# Patient Record
Sex: Male | Born: 2000 | Race: White | Hispanic: No | Marital: Single | Smoking: Former smoker
Health system: Southern US, Community
[De-identification: ages and names within clinical notes are randomized; demographics above are authoritative.]

## PROBLEM LIST (undated history)

## (undated) HISTORY — PX: HYDROCELE EXCISION: SHX482

---
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
## 2022-01-16 ENCOUNTER — Ambulatory Visit (INDEPENDENT_AMBULATORY_CARE_PROVIDER_SITE_OTHER): Payer: Medicaid Other

## 2022-01-16 ENCOUNTER — Ambulatory Visit (INDEPENDENT_AMBULATORY_CARE_PROVIDER_SITE_OTHER): Payer: Medicaid Other | Admitting: Orthopedic Surgery

## 2022-01-16 DIAGNOSIS — S62025K Nondisplaced fracture of middle third of navicular [scaphoid] bone of left wrist, subsequent encounter for fracture with nonunion: Secondary | ICD-10-CM

## 2022-01-16 NOTE — Progress Notes (Signed)
? ?  Post-Op Visit Note ?  ?Patient: Randy Schmitt           ?Date of Birth: [birth year]           ?MRN: [id number] ?Visit Date: 01/16/2022 ?PCP: Babs Sciara, MD ? ? ?Assessment & Plan: ? ?Chief Complaint:  ?Chief Complaint  ?Patient presents with  ? Left Wrist - Routine Post Op  ? ?Visit Diagnoses:  ?1. 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 ? ?Plan: Patient is now 6 weeks s/p ORIF of a left scaphoid nonunion with distal radius autograft.  There was an intraoperative fracture of the distal pole with screw insertion.  X-rays today show unchanged scaphoid waist fracture alignment with possible interval healing compared to preop imaging.  The distal pole fragment remains displaced.  He has no complaints today.  There is no significant swelling.  He is able to make a complete fist and fully extend his fingers.  I will put him back in a cast and see him again in another month.  ? ?Follow-Up Instructions: No follow-ups on file.  ? ?Orders:  ?Orders Placed This Encounter  ?Procedures  ? XR Wrist Complete Left  ? ?No orders of the defined types were placed in this encounter. ? ? ?Imaging: ?No results found. ? ?PMFS History: ?Patient Active Problem List  ? Diagnosis Date Noted  ? 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 Alcohol use disorder, severe, in early remission, in controlled environment [facility])   ? 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 Meningitis 07/19/2012  ? ?Past Medical History:  ?Diagnosis Date  ? Alcohol use disorder, mild, abuse 10/30/2021  ? 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 Meningitis 07/22/2012  ?  ?Family History  ?Problem Relation Age of Onset  ? Hypertension Father   ? Hypertension Maternal Grandfather   ? Healthy Mother   ?  ?Past Surgical History:  ?Procedure Laterality Date  ? HYDROCELE EXCISION    ? ORIF SCAPHOID FRACTURE Left  12/03/2021  ? Procedure: LEFT OPEN REDUCTION INTERNAL FIXATION (ORIF) SCAPHOID FRACTURE WITH DISTAL RADIUS BONE GRAFT HARVEST;  Surgeon: Marlyne Beards, MD;  Location: [facility];  Service: Orthopedics;  Laterality: Left;  Regional block  ? ?Social History  ? ?Occupational History  ? Not on file  ?Tobacco Use  ? Smoking status: Every Day  ?  Packs/day: 1.00  ?  Types: Cigarettes  ?  Passive exposure: Yes  ? Smokeless tobacco: Never  ?Vaping Use  ? Vaping Use: Never used  ?Substance and Sexual Activity  ? Alcohol use: Yes  ?  Comment: up to 12 pk a day on average- Going to AA mtgs 11/24/21  ? Drug use: Yes  ?  Frequency: 2.0 times per week  ?  Types: Marijuana  ? Sexual activity: Not on file  ? ? ? ?

## 2022-02-17 ENCOUNTER — Ambulatory Visit (INDEPENDENT_AMBULATORY_CARE_PROVIDER_SITE_OTHER): Payer: Medicaid Other

## 2022-02-17 ENCOUNTER — Ambulatory Visit (INDEPENDENT_AMBULATORY_CARE_PROVIDER_SITE_OTHER): Payer: Medicaid Other | Admitting: Orthopedic Surgery

## 2022-02-17 DIAGNOSIS — S62025K Nondisplaced fracture of middle third of navicular [scaphoid] bone of left wrist, subsequent encounter for fracture with nonunion: Secondary | ICD-10-CM

## 2022-02-17 DIAGNOSIS — S62022A Displaced fracture of middle third of navicular [scaphoid] bone of left wrist, initial encounter for closed fracture: Secondary | ICD-10-CM

## 2022-02-17 NOTE — Progress Notes (Signed)
   Post-Op Visit Note   Patient: Randy Schmitt           Date of Birth: June 13, 2001           MRN: [id number] Visit Date: 02/17/2022 PCP: Babs Sciara, MD   Assessment & Plan:  Chief Complaint:  Chief Complaint  Patient presents with   Left Wrist - Follow-up, Fracture, Routine Post Op   Visit Diagnoses:  1. 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 Closed displaced fracture of middle third of scaphoid bone of left wrist, initial encounter     Plan: Patient is 10 weeks s/p ORIF of left scaphoid fracture nonunion with intraop fracture of the distal pole with screw insertion.  He has been in a cast for the last two months.  He only describes pain at the area of bone graft harvest with retropulsion of thumb. Repeat x-rays today appear to show interval healing of the scaphoid fracture with apparent filling in of the previous void from the nonunion debridement.  I will get a CT to better evaluate the scaphoid waist fracture healing and will see him back in the office once the CT is done.   Follow-Up Instructions: No follow-ups on file.   Orders:  Orders Placed This Encounter  Procedures   XR Wrist Complete Left   No orders of the defined types were placed in this encounter.   Imaging: No results found.  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   Meningitis 07/22/2012    Family History  Problem Relation Age of Onset   Hypertension Father    Hypertension Maternal Grandfather     Healthy Mother     Past Surgical History:  Procedure Laterality Date   HYDROCELE EXCISION     ORIF SCAPHOID FRACTURE Left 12/03/2021   Procedure: LEFT OPEN REDUCTION INTERNAL FIXATION (ORIF) SCAPHOID FRACTURE WITH DISTAL RADIUS BONE GRAFT HARVEST;  Surgeon: Marlyne Beards, MD;  Location: [facility];  Service: Orthopedics;  Laterality: Left;  Regional block   Social History   Occupational History   Not on file  Tobacco Use   Smoking status: Every Day    Packs/day: 1.00    Types: Cigarettes    Passive exposure: Yes   Smokeless tobacco: Never  Vaping Use   Vaping Use: Never used  Substance and Sexual Activity   Alcohol use: Yes    Comment: up to 12 pk a day on average- Going to AA mtgs 11/24/21   Drug use: Yes    Frequency: 2.0 times per week    Types: Marijuana   Sexual activity: Not on file

## 2022-02-24 ENCOUNTER — Ambulatory Visit (HOSPITAL_COMMUNITY)
Admission: RE | Admit: 2022-02-24 | Discharge: 2022-02-24 | Disposition: A | Discharge status: Home / Self Care | Payer: Medicaid Other | Source: Ambulatory Visit | Attending: Orthopedic Surgery | Admitting: Orthopedic Surgery

## 2022-02-24 DIAGNOSIS — S62022A Displaced fracture of middle third of navicular [scaphoid] bone of left wrist, initial encounter for closed fracture: Secondary | ICD-10-CM | POA: Diagnosis not present

## 2022-02-24 DIAGNOSIS — Z9889 Other specified postprocedural states: Secondary | ICD-10-CM | POA: Diagnosis not present

## 2022-02-24 DIAGNOSIS — S62002D Unspecified fracture of navicular [scaphoid] bone of left wrist, subsequent encounter for fracture with routine healing: Secondary | ICD-10-CM | POA: Diagnosis not present

## 2022-02-26 ENCOUNTER — Ambulatory Visit (INDEPENDENT_AMBULATORY_CARE_PROVIDER_SITE_OTHER): Payer: Medicaid Other | Admitting: Orthopedic Surgery

## 2022-02-26 DIAGNOSIS — S62025K Nondisplaced fracture of middle third of navicular [scaphoid] bone of left wrist, subsequent encounter for fracture with nonunion: Secondary | ICD-10-CM

## 2022-02-26 NOTE — Progress Notes (Signed)
   Post-Op Visit Note   Patient: Randy Schmitt           Date of Birth: June 30, 2001           MRN: [id number] Visit Date: 02/26/2022 PCP: Babs Sciara, MD   Assessment & Plan:  Chief Complaint:  Chief Complaint  Patient presents with   Left Wrist - Follow-up   Visit Diagnoses:  1. 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  He had an intraoperative fracture of the scaphoid tuberosity.  He was then went CT of the wrist which showed partial healing of the scaphoid fracture mostly at the posterior aspect.  Discussed with patient that I would like to keep him immobilized for another month or so to allow the nonunion site to continue to heal.  We will get repeat x-rays of the scaphoid when I see him back.  Follow-Up Instructions: No follow-ups on file.   Orders:  No orders of the defined types were placed in this encounter.  No orders of the defined types were placed in this encounter.   Imaging: No results found.  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   Meningitis 07/22/2012    Family History  Problem Relation Age of Onset   Hypertension Father    Hypertension Maternal Grandfather    Healthy Mother     Past Surgical History:  Procedure Laterality Date   HYDROCELE EXCISION     ORIF SCAPHOID FRACTURE Left 12/03/2021   Procedure: LEFT OPEN REDUCTION INTERNAL  FIXATION (ORIF) SCAPHOID FRACTURE WITH DISTAL RADIUS BONE GRAFT HARVEST;  Surgeon: Marlyne Beards, MD;  Location: [facility];  Service: Orthopedics;  Laterality: Left;  Regional block   Social History   Occupational History   Not on file  Tobacco Use   Smoking status: Every Day    Packs/day: 1.00    Types: Cigarettes    Passive exposure: Yes   Smokeless tobacco: Never  Vaping Use   Vaping Use: Never used  Substance and Sexual Activity   Alcohol use: Yes    Comment: up to 12 pk a day on average- Going to AA mtgs 11/24/21   Drug use: Yes    Frequency: 2.0 times per week    Types: Marijuana   Sexual activity: Not on file

## 2022-03-04 ENCOUNTER — Ambulatory Visit (INDEPENDENT_AMBULATORY_CARE_PROVIDER_SITE_OTHER): Payer: Medicaid Other | Admitting: Family Medicine

## 2022-03-04 DIAGNOSIS — F419 Anxiety disorder, unspecified: Secondary | ICD-10-CM

## 2022-03-04 MED ORDER — NICOTINE 21 MG/24HR TD PT24: 21.0000 mg | MEDICATED_PATCH | Freq: Every day | TRANSDERMAL | 2 refills | Status: DC

## 2022-03-04 MED ORDER — BUSPIRONE HCL 7.5 MG PO TABS: 7.5000 mg | ORAL_TABLET | Freq: Two times a day (BID) | ORAL | 3 refills | Status: DC

## 2022-03-04 NOTE — Patient Instructions (Signed)
Try the Buspar.  Follow up in 6 weeks with Dr. Nicki Reaper.  I have refilled the nicotine patches.  Take care  Dr. Lacinda Axon

## 2022-03-04 NOTE — Assessment & Plan Note (Signed)
Trial of BuSpar. 

## 2022-03-04 NOTE — Progress Notes (Signed)
Subjective:  Patient ID: Randy Schmitt, male    DOB: [birth year]  Age: 21 y.o. MRN: [id number]  CC: Chief Complaint  Patient presents with   Anxiety    Panic attcaks, Cant sleep, smoking cessation prescription needed    HPI:  21 year old male presents for evaluation of the above.  Patient states that he is having anxiety and panic attacks and is having trouble focusing.  He also reports ongoing insomnia.  He states that he is no longer drinking alcohol on a regular basis.  He does continue to smoke and would like to restart nicotine patches.  Patient has previously been on Lexapro and hydroxyzine regarding his mood.  However, he has not been compliant with this.  Patient states that he has been having trouble for some time.  He is interested in pharmacotherapy.  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 11/07/2021   Alcohol use disorder, severe, in early remission, in controlled environment [facility])    Aseptic meningitis 07/22/2012    Social Hx   Social History   Socioeconomic History   Marital status: Single    Spouse name: Not on file   Number of children: Not on file   Years of education: Not on file   Highest education level: Not on file  Occupational History   Not on file  Tobacco Use   Smoking status: Every Day    Packs/day: 1.00    Types: Cigarettes    Passive exposure: Yes   Smokeless tobacco: Never  Vaping Use   Vaping Use: Never used  Substance and Sexual Activity   Alcohol use: Yes    Comment: up to 12 pk a day on average- Going to AA mtgs 11/24/21   Drug use: Yes    Frequency: 2.0 times per week    Types: Marijuana   Sexual activity: Not on file  Other Topics Concern   Not on file  Social History Narrative   Not on file   Social Determinants of Health   Financial Resource Strain: Not on file  Food Insecurity: Not on file   Transportation Needs: Not on file  Physical Activity: Not on file  Stress: Not on file  Social Connections: Not on file    Review of Systems Per HPI  Objective:  BP 127/77   Pulse 77   Temp 98.8 F (37.1 C)   Ht 6\' 1"  (1.854 m)   Wt 218 lb (98.9 kg)   SpO2 99%   BMI 28.76 kg/m      03/04/2022   11:10 AM 12/03/2021   12:05 PM 12/03/2021   11:30 AM  BP/Weight  Systolic BP 127 170 133  Diastolic BP 77 90 70  Wt. (Lbs) 218    BMI 28.76 kg/m2      Physical Exam Vitals and nursing note reviewed.  Constitutional:      General: He is not in acute distress.    Appearance: Normal appearance.  Eyes:     General:        Right eye: No discharge.        Left eye: No discharge.     Conjunctiva/sclera: Conjunctivae normal.  Cardiovascular:     Rate and Rhythm: Normal rate and regular rhythm.  Pulmonary:     Effort: Pulmonary effort is normal.     Breath sounds: Normal breath sounds. No wheezing, rhonchi or  rales.  Neurological:     Mental Status: He is alert.  Psychiatric:        Mood and Affect: Mood normal.        Behavior: Behavior normal.    Lab Results  Component Value Date   WBC 12.0 (H) 10/30/2021   HGB 16.3 10/30/2021   HCT 48.6 10/30/2021   PLT 266 10/30/2021   GLUCOSE 67 (L) 10/30/2021   CHOL 174 (H) 10/30/2021   TRIG 203 (H) 10/30/2021   HDL 67 10/30/2021   LDLCALC 66 10/30/2021   ALT 22 10/30/2021   AST 34 10/30/2021   NA 143 10/30/2021   K 4.2 10/30/2021   CL 104 10/30/2021   CREATININE 0.71 10/30/2021   BUN 9 10/30/2021   CO2 26 10/30/2021   TSH 0.709 10/30/2021   HGBA1C 5.0 10/30/2021     Assessment & Plan:   Problem List Items Addressed This Visit       Other   Anxiety    Trial of BuSpar.       Relevant Medications   busPIRone (BUSPAR) 7.5 MG tablet    Meds ordered this encounter  Medications   nicotine (NICODERM CQ - DOSED IN MG/24 HOURS) 21 mg/24hr patch    Sig: Place 1 patch (21 mg total) onto the skin daily.    Dispense:   28 patch    Refill:  2   busPIRone (BUSPAR) 7.5 MG tablet    Sig: Take 1 tablet (7.5 mg total) by mouth 2 (two) times daily.    Dispense:  60 tablet    Refill:  3    Follow-up:  Return in about 6 weeks (around 04/15/2022).  Everlene Other DO [facility]

## 2022-03-30 ENCOUNTER — Ambulatory Visit: Payer: Medicaid Other | Admitting: Orthopedic Surgery

## 2022-04-06 ENCOUNTER — Ambulatory Visit (INDEPENDENT_AMBULATORY_CARE_PROVIDER_SITE_OTHER): Payer: Medicaid Other | Admitting: Orthopedic Surgery

## 2022-04-06 ENCOUNTER — Ambulatory Visit (INDEPENDENT_AMBULATORY_CARE_PROVIDER_SITE_OTHER): Payer: Medicaid Other

## 2022-04-06 DIAGNOSIS — S62025A Nondisplaced fracture of middle third of navicular [scaphoid] bone of left wrist, initial encounter for closed fracture: Secondary | ICD-10-CM | POA: Diagnosis not present

## 2022-04-06 DIAGNOSIS — S62022A Displaced fracture of middle third of navicular [scaphoid] bone of left wrist, initial encounter for closed fracture: Secondary | ICD-10-CM

## 2022-04-06 NOTE — Progress Notes (Signed)
Office Visit Note   Patient: Randy Schmitt           Date of Birth: [birth year]           MRN: [id number] Visit Date: 04/06/2022              Requested by: Babs Sciara, MD [address] [place],  [place] [address] PCP: Babs Sciara, MD   Assessment & Plan: Visit Diagnoses:  1. Closed displaced fracture of middle third of scaphoid bone of left wrist, initial encounter   2. Closed nondisplaced fracture of middle third of scaphoid bone of left wrist, initial encounter     Plan: Patient is approximately 16 weeks s/p ORIF left scaphoid waist nonunion with distal radius autograft and headless compression screw fixation.  There was an intraop fracture of the scaphoid tuberosity with introduction of the trailing end of the screw.  He has some stiffness and tightness in the wrist today which is expected given his prolonged casting.  He has some tenderness to palpation at the volar aspect of the wrist over the incision which may be related to that tuberosity fragment.  Repeat x-rays do seem to show interval healing of the fracture.  I would like to get a CT scan to further evaluate the interval healing of the fracture.  In the mean time, he can have a removable wrist brace for work.  I can see him back once the CT scan is completed.   Follow-Up Instructions: No follow-ups on file.   Orders:  Orders Placed This Encounter  Procedures   XR Wrist Complete Left   CT WRIST LEFT WO CONTRAST   No orders of the defined types were placed in this encounter.     Procedures: No procedures performed   Clinical Data: No additional findings.   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  He is now 16 weeks status post ORIF of the left scaphoid through a volar approach with use of distal radius autograft.  He has  been in a cast since then.  He is doing well today.  He does have some tightness in his wrist which is predictable given his prolonged immobilization.  He is overall without complaint.    Review of Systems   Objective: Vital Signs: There were no vitals taken for this visit.  Physical Exam Constitutional:      Appearance: Normal appearance.  Cardiovascular:     Rate and Rhythm: Normal rate.     Pulses: Normal pulses.  Pulmonary:     Effort: Pulmonary effort is normal.  Skin:    General: Skin is warm and dry.     Capillary Refill: Capillary refill takes less than 2 seconds.  Neurological:     Mental Status: He is alert.     Left Hand Exam   Tenderness  Left hand tenderness location: Mildly TTP at volar aspect of scaphoid over incision.   Other  Erythema: absent Sensation: normal Pulse: present  Comments:  Painless wrist ROM but somewhat limited by stiffness.       Specialty Comments:  No specialty comments available.  Imaging: No results found.   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   Meningitis 07/22/2012    Family History  Problem Relation Age of Onset   Hypertension Father    Hypertension Maternal Grandfather    Healthy Mother     Past Surgical History:  Procedure Laterality Date   HYDROCELE EXCISION     ORIF SCAPHOID FRACTURE Left 12/03/2021   Procedure: LEFT OPEN REDUCTION INTERNAL FIXATION (ORIF) SCAPHOID FRACTURE WITH DISTAL RADIUS BONE GRAFT HARVEST;  Surgeon: Marlyne Beards, MD;  Location: [facility];  Service: Orthopedics;  Laterality: Left;  Regional block   Social History   Occupational History   Not on file  Tobacco Use   Smoking status: Every Day    Packs/day: 1.00    Types: Cigarettes    Passive exposure: Yes   Smokeless tobacco: Never  Vaping Use   Vaping Use: Never used  Substance and Sexual Activity   Alcohol use: Yes    Comment: up to 12 pk a day on average- Going to AA mtgs 11/24/21   Drug use: Yes    Frequency: 2.0 times per week    Types: Marijuana   Sexual activity: Not on file

## 2022-04-17 ENCOUNTER — Ambulatory Visit (HOSPITAL_COMMUNITY)
Admission: RE | Admit: 2022-04-17 | Discharge: 2022-04-17 | Disposition: A | Discharge status: Home / Self Care | Payer: Medicaid Other | Source: Ambulatory Visit | Attending: Orthopedic Surgery | Admitting: Orthopedic Surgery

## 2022-04-17 DIAGNOSIS — S62022A Displaced fracture of middle third of navicular [scaphoid] bone of left wrist, initial encounter for closed fracture: Secondary | ICD-10-CM | POA: Diagnosis present

## 2022-04-17 DIAGNOSIS — S62025A Nondisplaced fracture of middle third of navicular [scaphoid] bone of left wrist, initial encounter for closed fracture: Secondary | ICD-10-CM | POA: Insufficient documentation

## 2022-04-21 ENCOUNTER — Ambulatory Visit (INDEPENDENT_AMBULATORY_CARE_PROVIDER_SITE_OTHER): Payer: Medicaid Other | Admitting: Family Medicine

## 2022-04-21 VITALS — BP 136/84 | HR 71 | Temp 97.3°F | Ht 73.02 in | Wt 208.0 lb

## 2022-04-21 DIAGNOSIS — F411 Generalized anxiety disorder: Secondary | ICD-10-CM | POA: Diagnosis not present

## 2022-04-21 MED ORDER — ESCITALOPRAM OXALATE 10 MG PO TABS: 10.0000 mg | ORAL_TABLET | Freq: Every day | ORAL | 2 refills | Status: DC

## 2022-04-21 NOTE — Progress Notes (Signed)
   Subjective:    Patient ID: Randy Schmitt, male    DOB: [birth year], 21 y.o.   MRN: [id number]  HPI  6 week follow up for anxiety currently taking buspirone, no concerns voiced Insurance did not cover nicoderm CQ for smoking cessation Patient finds himself feeling anxious.  States BuSpar really did not help much Just gets a little anxious about various situations denies any particular anxiousness issues.  Denies being depressed.  Review of Systems     Objective:   Physical Exam  Today's visit was centered around  discussion      Assessment & Plan:  Anxiety Tobacco abuse History of alcohol use  Patient states he is staying away from alcohol basilar joint times his head and occasionally slipped up Has surrounded himself with family members who are supportive  BuSpar did not do much for him for anxiety.  In the past he did take Lexapro but did not like taking a antidepressant.  We talked about how antidepressants are often used for anxiety and I showed him some various findings regarding this.  After further discussion regarding the options of this medication versus Celexa or Zoloft patient agrees with Lexapro and would like to follow-up in 1 month.  He was warned that if it started making him feel depressed or suicidal.  Medicine reach out for help with this immediately.  In addition to this we will see him back in 1 month.  Benzodiazepines are not recommended because of the addictive potential  Smoking cessation with NicoDerm patch recommended strategies discussed

## 2022-04-23 ENCOUNTER — Other Ambulatory Visit: Payer: Self-pay | Admitting: Orthopedic Surgery

## 2022-04-23 DIAGNOSIS — S62022A Displaced fracture of middle third of navicular [scaphoid] bone of left wrist, initial encounter for closed fracture: Secondary | ICD-10-CM

## 2022-04-23 NOTE — Progress Notes (Signed)
Called patient to review CT results which show incomplete union of the previous scaphoid nonunion.  There is some bridging bone present but overall minimal healing.  Pt has been at work without the brace since the CT scan.  We discussed the nature of his fracture healing.  Discussed that we can try a bone stimulator to try to get the scaphoid nonunion to heal.  He will remain in the over the counter brace and he will be contacted by OT to have a thermoplast brace made.  I have reached out to [employer] regarding their device.  The appropriate forms will be faxed to them to initiate the process.  Marlyne Beards, M.D.  [facility]

## 2022-05-27 ENCOUNTER — Ambulatory Visit: Payer: Medicaid Other | Admitting: Family Medicine

## 2022-09-03 ENCOUNTER — Encounter: Payer: Self-pay | Admitting: Family Medicine

## 2022-09-03 ENCOUNTER — Ambulatory Visit (INDEPENDENT_AMBULATORY_CARE_PROVIDER_SITE_OTHER): Payer: Medicaid Other | Admitting: Family Medicine

## 2022-09-03 VITALS — BP 138/73 | HR 93 | Wt 216.4 lb

## 2022-09-03 DIAGNOSIS — H6092 Unspecified otitis externa, left ear: Secondary | ICD-10-CM

## 2022-09-03 HISTORY — DX: Unspecified otitis externa, left ear: H60.92

## 2022-09-03 MED ORDER — CIPROFLOXACIN-DEXAMETHASONE 0.3-0.1 % OT SUSP: 4.0000 [drp] | Freq: Two times a day (BID) | OTIC | 0 refills | Status: AC

## 2022-09-03 NOTE — Progress Notes (Signed)
Subjective:  Patient ID: Randy Schmitt, male    DOB: 04-17-2001  Age: 21 y.o. MRN: [id number]  CC: Chief Complaint  Patient presents with   Ear Discomfort    Pt arrives to have left ear checked.     HPI:  21 year old male presents for evaluation the above.  Patient states that over the past month he has had ongoing issues with his left ear.  He has had some pain and is also had some decreased hearing.  He states that he uses Q-tips on a regular basis.  It only affects the left ear.  No other associated symptoms.  No other complaints.  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 11/07/2021   Alcohol use disorder, severe, in early remission, in controlled environment [facility])    Aseptic meningitis 07/22/2012    Social Hx   Social History   Socioeconomic History   Marital status: Single    Spouse name: Not on file   Number of children: Not on file   Years of education: Not on file   Highest education level: Not on file  Occupational History   Not on file  Tobacco Use   Smoking status: Every Day    Packs/day: 1.00    Types: Cigarettes    Passive exposure: Yes   Smokeless tobacco: Never  Vaping Use   Vaping Use: Never used  Substance and Sexual Activity   Alcohol use: Yes    Comment: up to 12 pk a day on average- Going to AA mtgs 11/24/21   Drug use: Yes    Frequency: 2.0 times per week    Types: Marijuana   Sexual activity: Not on file  Other Topics Concern   Not on file  Social History Narrative   Not on file   Social Determinants of Health   Financial Resource Strain: Not on file  Food Insecurity: Not on file  Transportation Needs: Not on file  Physical Activity: Not on file  Stress: Not on file  Social Connections: Not on file    Review of Systems Per HPI  Objective:  BP 138/73   Pulse 93   Wt  216 lb 6.4 oz (98.2 kg)   SpO2 98%   BMI 28.53 kg/m      09/03/2022    8:53 AM 04/21/2022    8:11 AM 03/04/2022   11:10 AM  BP/Weight  Systolic BP 138 136 127  Diastolic BP 73 84 77  Wt. (Lbs) 216.4 208 218  BMI 28.53 kg/m2 27.43 kg/m2 28.76 kg/m2    Physical Exam Vitals and nursing note reviewed.  Constitutional:      General: He is not in acute distress.    Appearance: Normal appearance.  HENT:     Head:     Comments: Left ear -patient's canal is quite erythematous.  Normal TM. Eyes:     General:        Right eye: No discharge.        Left eye: No discharge.     Conjunctiva/sclera: Conjunctivae normal.  Pulmonary:     Effort: Pulmonary effort is normal. No respiratory distress.  Neurological:     Mental Status: He is alert.     Lab Results  Component Value Date   WBC 12.0 (H) 10/30/2021   HGB 16.3 10/30/2021  HCT 48.6 10/30/2021   PLT 266 10/30/2021   GLUCOSE 67 (L) 10/30/2021   CHOL 174 (H) 10/30/2021   TRIG 203 (H) 10/30/2021   HDL 67 10/30/2021   LDLCALC 66 10/30/2021   ALT 22 10/30/2021   AST 34 10/30/2021   NA 143 10/30/2021   K 4.2 10/30/2021   CL 104 10/30/2021   CREATININE 0.71 10/30/2021   BUN 9 10/30/2021   CO2 26 10/30/2021   TSH 0.709 10/30/2021   HGBA1C 5.0 10/30/2021     Assessment & Plan:   Problem List Items Addressed This Visit       Nervous and Auditory   Otitis externa of left ear - Primary    Ciprodex as prescribed.       Meds ordered this encounter  Medications   ciprofloxacin-dexamethasone (CIPRODEX) OTIC suspension    Sig: Place 4 drops into the left ear 2 (two) times daily for 7 days.    Dispense:  7.5 mL    Refill:  0    Follow-up:  Return if symptoms worsen or fail to improve.  Everlene Other DO [facility]

## 2022-09-03 NOTE — Assessment & Plan Note (Signed)
Ciprodex as prescribed.

## 2022-09-03 NOTE — Patient Instructions (Signed)
Medication as prescribed.  No more Q-tips.  Take care  Dr. Adriana Simas

## 2022-09-12 ENCOUNTER — Ambulatory Visit
Admission: EM | Admit: 2022-09-12 | Discharge: 2022-09-12 | Disposition: A | Discharge status: Home / Self Care | Payer: Medicaid Other | Attending: Family Medicine | Admitting: Family Medicine

## 2022-09-12 DIAGNOSIS — R058 Other specified cough: Secondary | ICD-10-CM | POA: Insufficient documentation

## 2022-09-12 DIAGNOSIS — Z1152 Encounter for screening for COVID-19: Secondary | ICD-10-CM | POA: Insufficient documentation

## 2022-09-12 DIAGNOSIS — J208 Acute bronchitis due to other specified organisms: Secondary | ICD-10-CM | POA: Diagnosis present

## 2022-09-12 DIAGNOSIS — J069 Acute upper respiratory infection, unspecified: Secondary | ICD-10-CM

## 2022-09-12 LAB — RESP PANEL BY RT-PCR (FLU A&B, COVID) ARPGX2
Influenza A by PCR: NEGATIVE
Influenza B by PCR: NEGATIVE
SARS Coronavirus 2 by RT PCR: NEGATIVE

## 2022-09-12 MED ORDER — ALBUTEROL SULFATE HFA 108 (90 BASE) MCG/ACT IN AERS: 2.0000 | INHALATION_SPRAY | RESPIRATORY_TRACT | 0 refills | Status: DC | PRN

## 2022-09-12 MED ORDER — PREDNISONE 20 MG PO TABS: 40.0000 mg | ORAL_TABLET | Freq: Every day | ORAL | 0 refills | Status: DC

## 2022-09-12 MED ORDER — FLUTICASONE PROPIONATE 50 MCG/ACT NA SUSP: 1.0000 | Freq: Two times a day (BID) | NASAL | 2 refills | Status: DC

## 2022-09-12 MED ORDER — PROMETHAZINE-DM 6.25-15 MG/5ML PO SYRP: 5.0000 mL | ORAL_SOLUTION | Freq: Four times a day (QID) | ORAL | 0 refills | Status: DC | PRN

## 2022-09-12 NOTE — ED Triage Notes (Signed)
Pt reports he " can't really breathe" due to chest congestion,says he's raspy when he breathes, can't sleep, sore throat , headache, left ear pain and fullness x 4 days. Took nyquil, ibuprofen, tylenol but no relief.

## 2022-09-12 NOTE — ED Provider Notes (Signed)
[facility]    CSN: [id number] Arrival date & time: 09/12/22  7867      History   Chief Complaint No chief complaint on file.   HPI Randy Schmitt is a 21 y.o. male.   Presenting today with 4-day history of fever, chills, body aches, sore throat, cough, headache, left ear pressure, hacking cough, chest tightness.  Denies chest pain, shortness of breath, abdominal pain, nausea vomiting or diarrhea.  So far trying NyQuil, ibuprofen, Tylenol with minimal relief.  Multiple sick contacts recently.  No known history of chronic pulmonary disease.    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   Alcohol use disorder, severe, in early remission, in controlled environment [facility])    Aseptic meningitis 07/22/2012    Past Surgical History:  Procedure Laterality Date   HYDROCELE EXCISION     ORIF SCAPHOID FRACTURE Left 12/03/2021   Procedure: LEFT OPEN REDUCTION INTERNAL FIXATION (ORIF) SCAPHOID FRACTURE WITH DISTAL RADIUS BONE GRAFT HARVEST;  Surgeon: Marlyne Beards, MD;  Location: [facility];  Service: Orthopedics;  Laterality: Left;  Regional block       Home Medications    Prior to Admission medications   Medication Sig Start Date End Date Taking? Authorizing Provider  albuterol (VENTOLIN HFA) 108 (90 Base) MCG/ACT inhaler Inhale 2 puffs into the lungs every 4 (four) hours as needed for wheezing or shortness of breath. 09/12/22  Yes Particia Nearing, PA-C  predniSONE (DELTASONE) 20 MG tablet Take 2 tablets (40 mg total) by mouth daily with breakfast. 09/12/22  Yes Particia Nearing, PA-C  promethazine-dextromethorphan (PROMETHAZINE-DM) 6.25-15 MG/5ML syrup Take 5 mLs by mouth 4 (four) times daily as needed. 09/12/22  Yes Particia Nearing, PA-C    Family History Family History  Problem Relation Age of Onset   Hypertension Father    Hypertension Maternal Grandfather    Healthy Mother     Social History Social History   Tobacco Use   Smoking status: Every Day    Packs/day: 1.00    Types: Cigarettes    Passive exposure: Yes   Smokeless tobacco: Never  Vaping Use   Vaping Use: Never used  Substance Use Topics   Alcohol use: Yes    Comment: up to 12 pk a day on average- Going to AA mtgs 11/24/21   Drug use: Yes    Frequency: 2.0 times per week    Types: Marijuana     Allergies   Patient has no known allergies.   Review of Systems Review of Systems PER HPI  Physical Exam Triage Vital Signs ED Triage Vitals  Enc Vitals Group     BP 09/12/22 0954 126/86     Pulse Rate 09/12/22 0954 87     Resp 09/12/22 0954 20     Temp 09/12/22 0954 98.3 F (36.8 C)     Temp Source 09/12/22 0954 Oral     SpO2 09/12/22 0954 98 %     Weight --      Height --      Head Circumference --  Peak Flow --      Pain Score 09/12/22 0958 8     Pain Loc --      Pain Edu? --      Excl. in [place]? --    No data found.  Updated Vital Signs BP 126/86 (BP Location: Right Arm)   Pulse 87   Temp 98.3 F (36.8 C) (Oral)   Resp 20   SpO2 98%   Visual Acuity Right Eye Distance:   Left Eye Distance:   Bilateral Distance:    Right Eye Near:   Left Eye Near:    Bilateral Near:     Physical Exam Vitals and nursing note reviewed.  Constitutional:      Appearance: He is well-developed.  HENT:     Head: Atraumatic.     Right Ear: External ear normal.     Left Ear: External ear normal.     Nose: Rhinorrhea present.     Mouth/Throat:     Pharynx: Posterior oropharyngeal erythema present. No oropharyngeal exudate.  Eyes:      Conjunctiva/sclera: Conjunctivae normal.     Pupils: Pupils are equal, round, and reactive to light.  Cardiovascular:     Rate and Rhythm: Normal rate and regular rhythm.  Pulmonary:     Effort: Pulmonary effort is normal. No respiratory distress.     Breath sounds: No wheezing or rales.  Musculoskeletal:        General: Normal range of motion.     Cervical back: Normal range of motion and neck supple.  Lymphadenopathy:     Cervical: No cervical adenopathy.  Skin:    General: Skin is warm and dry.  Neurological:     Mental Status: He is alert and oriented to person, place, and time.  Psychiatric:        Behavior: Behavior normal.      UC Treatments / Results  Labs (all labs ordered are listed, but only abnormal results are displayed) Labs Reviewed  RESP PANEL BY RT-PCR (FLU A&B, COVID) [id number]    EKG   Radiology No results found.  Procedures Procedures (including critical care time)  Medications Ordered in UC Medications - No data to display  Initial Impression / Assessment and Plan / UC Course  I have reviewed the triage vital signs and the nursing notes.  Pertinent labs & imaging results that were available during my care of the patient were reviewed by me and considered in my medical decision making (see chart for details).     Suspect viral upper respiratory infection causing some bronchitis.  Treat with prednisone, Phenergan DM, albuterol inhaler and await respiratory panel.  Work note given.  Return for worsening symptoms.  Final Clinical Impressions(s) / UC Diagnoses   Final diagnoses:  Viral bronchitis  Viral URI with cough   Discharge Instructions   None    ED Prescriptions     Medication Sig Dispense Auth. Provider   predniSONE (DELTASONE) 20 MG tablet Take 2 tablets (40 mg total) by mouth daily with breakfast. 10 tablet Particia Nearing, PA-C   promethazine-dextromethorphan (PROMETHAZINE-DM) 6.25-15 MG/5ML syrup Take 5 mLs by mouth 4  (four) times daily as needed. 100 mL Particia Nearing, PA-C   albuterol (VENTOLIN HFA) 108 (90 Base) MCG/ACT inhaler Inhale 2 puffs into the lungs every 4 (four) hours as needed for wheezing or shortness of breath. 18 g Particia Nearing, [place]      PDMP not reviewed this encounter.   Particia Nearing,  PA-C 09/12/22 1019

## 2022-09-18 ENCOUNTER — Ambulatory Visit
Admission: EM | Admit: 2022-09-18 | Discharge: 2022-09-18 | Disposition: A | Discharge status: Home / Self Care | Payer: Medicaid Other | Attending: Nurse Practitioner | Admitting: Nurse Practitioner

## 2022-09-18 DIAGNOSIS — H66002 Acute suppurative otitis media without spontaneous rupture of ear drum, left ear: Secondary | ICD-10-CM

## 2022-09-18 DIAGNOSIS — J019 Acute sinusitis, unspecified: Secondary | ICD-10-CM

## 2022-09-18 DIAGNOSIS — B9689 Other specified bacterial agents as the cause of diseases classified elsewhere: Secondary | ICD-10-CM | POA: Diagnosis not present

## 2022-09-18 MED ORDER — BENZONATATE 100 MG PO CAPS: 100.0000 mg | ORAL_CAPSULE | Freq: Three times a day (TID) | ORAL | 0 refills | Status: DC | PRN

## 2022-09-18 MED ORDER — AMOXICILLIN-POT CLAVULANATE 875-125 MG PO TABS: 1.0000 | ORAL_TABLET | Freq: Two times a day (BID) | ORAL | 0 refills | Status: AC

## 2022-09-18 NOTE — Discharge Instructions (Addendum)
You have an ear infection in your left ear.  I am also suspicious he may have a sinus infection.  Please start the Augmentin and take it twice daily for 7 days.  Also start Tessalon Perles to help suppress a dry cough.  Make sure you are drinking plenty fluids.  You can take Mucinex as needed to help with the congestion to thin it out.

## 2022-09-18 NOTE — ED Triage Notes (Signed)
Cough, unable to hear out of left ear, back pain, headache, sore throat, SOB, started 2 weeks ago, came here 12/16 and said prednisone helped some but when ran out he still feels pretty bad.

## 2022-09-18 NOTE — ED Provider Notes (Signed)
[facility]    CSN: [id number] Arrival date & time: 09/18/22  1159      History   Chief Complaint Chief Complaint  Patient presents with   Cough    HPI BRONISLAUS JEFF is a 21 y.o. male.   Patient presents for 2-week history of fever, congested cough that is worse in the morning, shortness of breath and chest pain, chest tightness, chest and nasal congestion, runny nose, sore throat, headache, decreased appetite, and fatigue.  He is also having pain in his left ear with muffled hearing.  No drainage from the ear.  No sneezing, abdominal pain, nausea or vomiting, diarrhea, loss of taste or smell.  Reports he was seen 2 weeks ago and treated for viral bronchitis and took all the medication as prescribed.  Reports symptoms have worsened.    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 Authorizing Provider  albuterol (VENTOLIN HFA) 108 (90 Base) MCG/ACT inhaler Inhale 2 puffs into the lungs every 4 (four) hours as needed  for wheezing or shortness of breath. 09/12/22  Yes Particia Nearing, PA-C  amoxicillin-clavulanate (AUGMENTIN) 875-125 MG tablet Take 1 tablet by mouth 2 (two) times daily for 7 days. 09/18/22 09/25/22 Yes Valentino Nose, NP  benzonatate (TESSALON) 100 MG capsule Take 1 capsule (100 mg total) by mouth 3 (three) times daily as needed for cough. Do not take with alcohol or while driving or operating heavy machinery.  May cause drowsiness. 09/18/22  Yes Valentino Nose, NP  fluticasone (FLONASE) 50 MCG/ACT nasal spray Place 1 spray into both nostrils 2 (two) times daily. 09/12/22  Yes Particia Nearing, PA-C  predniSONE (DELTASONE) 20 MG tablet Take 2 tablets (40 mg total) by mouth daily with breakfast. 09/12/22  Yes Particia Nearing, PA-C  promethazine-dextromethorphan (PROMETHAZINE-DM) 6.25-15 MG/5ML syrup Take 5 mLs by mouth 4 (four) times daily as needed. 09/12/22  Yes Particia Nearing, PA-C    Family History Family History  Problem Relation Age of Onset   Hypertension Father    Hypertension Maternal Grandfather    Healthy Mother     Social History Social History   Tobacco Use   Smoking status: Every Day    Packs/day: 1.00    Types: Cigarettes    Passive exposure: Yes   Smokeless tobacco: Never  Vaping Use   Vaping Use: Never used  Substance Use Topics   Alcohol use: Yes    Comment: up to 12 pk a day on average- Going to  [facility] mt[facility] 11/24/21   Drug use: Yes    Frequency: 2.0 times per week    Types: Marijuana     Allergies   Patient has no known allergies.   Review of Systems Review of Systems Per HPI  Physical Exam Triage Vital Signs ED Triage Vitals [09/18/22 1432]  Enc Vitals Group     BP (!) 140/78     Pulse Rate 87     Resp 18     Temp 98.3 F (36.8 C)     Temp Source Oral     SpO2 98 %     Weight      Height      Head Circumference      Peak Flow      Pain Score 8     Pain Loc      Pain Edu?      Excl. in [place]?    No data  found.  Updated Vital Signs BP (!) 140/78 (BP Location: Right Arm)   Pulse 87   Temp 98.3 F (36.8 C) (Oral)   Resp 18   SpO2 98%   Visual Acuity Right Eye Distance:   Left Eye Distance:   Bilateral Distance:    Right Eye Near:   Left Eye Near:    Bilateral Near:     Physical Exam Vitals and nursing note reviewed.  Constitutional:      General: He is not in acute distress.    Appearance: Normal appearance. He is not ill-appearing or toxic-appearing.  HENT:     Head: Normocephalic and atraumatic.     Right Ear: Ear canal and external ear normal. Tympanic membrane is erythematous.     Left Ear: Ear canal and external ear normal. Tympanic membrane is erythematous and bulging. Tympanic membrane is not perforated.     Nose: Congestion and rhinorrhea present.     Mouth/Throat:     Mouth: Mucous membranes are moist.     Pharynx: Oropharynx is clear. Posterior oropharyngeal erythema present. No oropharyngeal exudate.  Eyes:     General: No scleral icterus.    Extraocular Movements: Extraocular movements intact.  Cardiovascular:     Rate and Rhythm: Normal rate and regular rhythm.     Heart sounds: Normal heart sounds. No murmur heard. Pulmonary:     Effort: Pulmonary effort is normal. No respiratory distress.     Breath sounds: Normal breath sounds. No wheezing, rhonchi or rales.  Abdominal:     General: Abdomen is flat. Bowel sounds are normal. There is no distension.     Palpations: Abdomen is soft.     Tenderness: There is no abdominal tenderness.  Musculoskeletal:     Cervical back: Normal range of motion and neck supple.  Lymphadenopathy:     Cervical: Cervical adenopathy present.  Skin:    General: Skin is warm and dry.     Coloration: Skin is not jaundiced or pale.     Findings: No erythema or rash.  Neurological:     Mental Status: He is alert and oriented to person, place, and time.  Psychiatric:        Behavior: Behavior is cooperative.      UC  Treatments / Results  Labs (all labs ordered are listed, but only abnormal results are displayed) Labs Reviewed - No data to display  EKG   Radiology No results found.  Procedures Procedures (including critical care time)  Medications Ordered in UC Medications - No data to display  Initial Impression / Assessment and Plan / UC Course  I have reviewed the triage vital signs and the nursing notes.  Pertinent labs & imaging results that were available during my care of the patient were reviewed by me and considered in my medical decision making (see chart for details).   Patient is well-appearing, normotensive, afebrile, not tachycardic, not tachypneic, oxygenating well on room air.    Non-recurrent acute suppurative otitis media of left ear without spontaneous rupture of tympanic membrane Acute bacterial sinusitis Treat with Augmentin twice daily for 7 days Continue supportive care ER and return precautions discussed  The patient was given the opportunity to ask questions.  All questions answered to their satisfaction.  The patient is in agreement to this plan.    Final Clinical Impressions(s) / UC Diagnoses   Final diagnoses:  Non-recurrent acute suppurative otitis media of left ear without spontaneous rupture of tympanic membrane  Acute bacterial sinusitis     Discharge Instructions      You have an ear infection in your left ear.  I am also suspicious he may have a sinus infection.  Please start the Augmentin and take it twice daily for 7 days.  Also start Tessalon Perles to help suppress a dry cough.  Make sure you are drinking plenty fluids.  You can take Mucinex as needed to help with the congestion to thin it out.     ED Prescriptions     Medication Sig Dispense Auth. Provider   amoxicillin-clavulanate (AUGMENTIN) 875-125 MG tablet Take 1 tablet by mouth 2 (two) times daily for 7 days. 14 tablet Cathlean Marseilles A, NP   benzonatate (TESSALON) 100 MG capsule  Take 1 capsule (100 mg total) by mouth 3 (three) times daily as needed for cough. Do not take with alcohol or while driving or operating heavy machinery.  May cause drowsiness. 21 capsule Valentino Nose, NP      PDMP not reviewed this encounter.   Valentino Nose, NP 09/18/22 1505

## 2022-09-27 ENCOUNTER — Ambulatory Visit
Admission: EM | Admit: 2022-09-27 | Discharge: 2022-09-27 | Disposition: A | Discharge status: Home / Self Care | Payer: Medicaid Other | Attending: Family Medicine | Admitting: Family Medicine

## 2022-09-27 DIAGNOSIS — J209 Acute bronchitis, unspecified: Secondary | ICD-10-CM

## 2022-09-27 DIAGNOSIS — J3089 Other allergic rhinitis: Secondary | ICD-10-CM | POA: Diagnosis not present

## 2022-09-27 DIAGNOSIS — H6992 Unspecified Eustachian tube disorder, left ear: Secondary | ICD-10-CM | POA: Diagnosis not present

## 2022-09-27 MED ORDER — CETIRIZINE HCL 10 MG PO TABS: 10.0000 mg | ORAL_TABLET | Freq: Every day | ORAL | 2 refills | Status: DC

## 2022-09-27 MED ORDER — PREDNISONE 20 MG PO TABS: 40.0000 mg | ORAL_TABLET | Freq: Every day | ORAL | 0 refills | Status: DC

## 2022-09-27 MED ORDER — FLUTICASONE PROPIONATE 50 MCG/ACT NA SUSP: 1.0000 | Freq: Two times a day (BID) | NASAL | 2 refills | Status: DC

## 2022-09-27 NOTE — ED Triage Notes (Signed)
Pt reports cough, trouble breathing, left "ear infection" brownish- red flem x 8 days. Headache x 4 days.  Took all his meds that were prescribed on 12/22 but no relief. Took tylenol and ibuprofen for the severe headache

## 2022-09-27 NOTE — ED Provider Notes (Signed)
[facility]    CSN: [id number] Arrival date & time: 09/27/22  1212      History   Chief Complaint Chief Complaint  Patient presents with   Cough    HPI Randy Schmitt is a 21 y.o. male.   Patient presenting today with several weeks of ongoing waxing and waning cough, chest tightness, wheezing, nasal congestion, headache, left ear pain.  Was seen about 2 weeks ago and diagnosed with bronchitis, treated with prednisone, nasal spray, inhaler which she states helped, then came in and was diagnosed with an ear infection about 5 days later, given a course of Augmentin which also seem to help some.  Symptoms returned after stopping all of the medications.  Not currently taking anything for symptoms apart from over-the-counter pain relievers.    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 Authorizing Provider  cetirizine (ZYRTEC ALLERGY) 10 MG tablet Take 1 tablet (10 mg total) by mouth daily. 09/27/22  Yes Particia Nearing, PA-C  fluticasone [facility]) 50 MCG/ACT nasal spray Place 1 spray into both nostrils 2 (two) times daily. 09/27/22  Yes Particia Nearing, PA-C  predniSONE (DELTASONE) 20 MG tablet Take 2 tablets (40 mg total) by mouth daily with breakfast. 09/27/22  Yes Particia Nearing, PA-C  albuterol (VENTOLIN HFA) 108 (90 Base) MCG/ACT inhaler Inhale 2 puffs into the lungs every 4 (four) hours as needed for wheezing or shortness of breath. 09/12/22   Particia Nearing, PA-C  benzonatate (TESSALON) 100 MG capsule Take 1 capsule (100 mg total) by mouth 3 (three) times daily as needed for cough. Do not take with alcohol or while driving or operating heavy machinery.  May cause drowsiness. 09/18/22   Valentino Nose, NP  fluticasone (FLONASE) 50 MCG/ACT nasal spray Place 1 spray into both nostrils 2 (two) times daily. 09/12/22   Particia Nearing, PA-C  predniSONE (DELTASONE) 20 MG tablet Take 2 tablets (40 mg total) by mouth daily with breakfast. 09/12/22   Particia Nearing, PA-C  promethazine-dextromethorphan (PROMETHAZINE-DM) 6.25-15 MG/5ML syrup Take 5 mLs by mouth 4 (four) times daily as needed. 09/12/22   Particia Nearing, PA-C    Family History Family History  Problem Relation Age of Onset   Hypertension Father    Hypertension Maternal Grandfather    Healthy Mother     Social History Social History   Tobacco Use   Smoking status: Every Day    Packs/day: 1.00    Types: Cigarettes  Passive exposure: Yes   Smokeless tobacco: Never  Vaping Use   Vaping Use: Never used  Substance Use Topics   Alcohol use: Yes    Comment: up to 12 pk a day on average- Going to AA mtgs 11/24/21   Drug use: Yes    Frequency: 2.0 times per week    Types: Marijuana     Allergies   Patient has no known allergies.   Review of Systems Review of Systems PER HPI  Physical Exam Triage Vital Signs ED Triage Vitals  Enc Vitals Group     BP 09/27/22 1431 133/80      Pulse Rate 09/27/22 1431 79     Resp 09/27/22 1431 20     Temp 09/27/22 1431 98.8 F (37.1 C)     Temp Source 09/27/22 1431 Oral     SpO2 09/27/22 1431 97 %     Weight --      Height --      Head Circumference --      Peak Flow --      Pain Score 09/27/22 1433 9     Pain Loc --      Pain Edu? --      Excl. in [place]? --    No data found.  Updated Vital Signs BP 133/80 (BP Location: Right Arm)   Pulse 79   Temp 98.8 F (37.1 C) (Oral)   Resp 20   SpO2 97%   Visual Acuity Right Eye Distance:   Left Eye Distance:   Bilateral Distance:    Right Eye Near:   Left Eye Near:    Bilateral Near:     Physical Exam Vitals and nursing note reviewed.  Constitutional:      Appearance: He is well-developed.  HENT:     Head: Atraumatic.     Right Ear: External ear normal.     Left Ear: External ear normal.     Ears:     Comments: Bilateral middle ear effusions worse on the left    Nose: Rhinorrhea present.     Mouth/Throat:     Pharynx: Posterior oropharyngeal erythema present. No oropharyngeal exudate.  Eyes:     Conjunctiva/sclera: Conjunctivae normal.     Pupils: Pupils are equal, round, and reactive to light.  Cardiovascular:     Rate and Rhythm: Normal rate and regular rhythm.  Pulmonary:     Effort: Pulmonary effort is normal. No respiratory distress.     Breath sounds: No wheezing or rales.  Musculoskeletal:        General: Normal range of motion.     Cervical back: Normal range of motion and neck supple.  Lymphadenopathy:     Cervical: No cervical adenopathy.  Skin:    General: Skin is warm and dry.  Neurological:     Mental Status: He is alert and oriented to person, place, and time.  Psychiatric:        Behavior: Behavior normal.      UC Treatments / Results  Labs (all labs ordered are listed, but only abnormal results are displayed) Labs Reviewed - No data to display  EKG   Radiology No results found.  Procedures Procedures (including critical  care time)  Medications Ordered in UC Medications - No data to display  Initial Impression / Assessment and Plan / UC Course  I have reviewed the triage vital signs and the nursing notes.  Pertinent labs & imaging results that were available during my care of  the patient were reviewed by me and considered in my medical decision making (see chart for details).     Suspicious for underlying seasonal allergies given duration of symptoms and resistance to full resolution with medications.  1 more round of prednisone was sent, and will start Zyrtec, Flonase consistently, over-the-counter cold and congestion medications as needed.  Return for worsening symptoms. Final Clinical Impressions(s) / UC Diagnoses   Final diagnoses:  Acute bronchitis, unspecified organism  Seasonal allergic rhinitis due to other allergic trigger  Eustachian tube dysfunction, left   Discharge Instructions   None    ED Prescriptions     Medication Sig Dispense Auth. Provider   predniSONE (DELTASONE) 20 MG tablet Take 2 tablets (40 mg total) by mouth daily with breakfast. 10 tablet Particia Nearing, PA-C   cetirizine (ZYRTEC ALLERGY) 10 MG tablet Take 1 tablet (10 mg total) by mouth daily. 30 tablet Particia Nearing, PA-C   fluticasone [facility]) 50 MCG/ACT nasal spray Place 1 spray into both nostrils 2 (two) times daily. 16 g Particia Nearing, [place]      PDMP not reviewed this encounter.   Particia Nearing, [place] 09/27/22 1535

## 2022-11-09 ENCOUNTER — Encounter: Payer: Self-pay | Admitting: Family Medicine

## 2022-11-09 ENCOUNTER — Ambulatory Visit (INDEPENDENT_AMBULATORY_CARE_PROVIDER_SITE_OTHER): Payer: Medicaid Other | Admitting: Family Medicine

## 2022-11-09 VITALS — BP 136/87 | HR 97 | Temp 99.3°F | Ht 73.02 in | Wt 207.0 lb

## 2022-11-09 DIAGNOSIS — R509 Fever, unspecified: Secondary | ICD-10-CM | POA: Diagnosis not present

## 2022-11-09 DIAGNOSIS — J020 Streptococcal pharyngitis: Secondary | ICD-10-CM

## 2022-11-09 DIAGNOSIS — J029 Acute pharyngitis, unspecified: Secondary | ICD-10-CM

## 2022-11-09 LAB — POCT RAPID STREP A (OFFICE): Rapid Strep A Screen: POSITIVE — AB

## 2022-11-09 MED ORDER — AMOXICILLIN-POT CLAVULANATE 875-125 MG PO TABS: 1.0000 | ORAL_TABLET | Freq: Two times a day (BID) | ORAL | 0 refills | Status: DC

## 2022-11-09 MED ORDER — CEFTRIAXONE SODIUM 500 MG IJ SOLR
500.0000 mg | Freq: Once | INTRAMUSCULAR | Status: AC
  Administered 2022-11-09: 500 mg via INTRAMUSCULAR

## 2022-11-09 NOTE — Progress Notes (Signed)
   Subjective:    Patient ID: Randy Schmitt, male    DOB: October 07, 2000, 22 y.o.   MRN: [id number]  HPI Significant sickness over the past 3 months with head congestion drainage coughing now starting to get somewhat better but now having sore throat body aches fever and chills over the past few days no wheezing or difficulty breathing   Review of Systems No vomiting or diarrhea    Objective:   Physical Exam Throat erythematous right side worse than left side with exudate neck supple lungs clear heart regular  Education given regarding tonsillar abscess and what to watch for notify us or ER immediately if having this     Assessment & Plan:   Rapid strep positive Strep throat Severe pain Tylenol ibuprofen as needed Rocephin 500 IM because of the severity Augmentin twice daily for 10 days If progressive troubles or worse follow-up If not improving in 48 hours follow-up office visit Work excuse next few days

## 2022-11-10 ENCOUNTER — Ambulatory Visit: Payer: Medicaid Other | Admitting: Family Medicine

## 2022-11-13 ENCOUNTER — Ambulatory Visit: Payer: Medicaid Other | Admitting: Nurse Practitioner

## 2023-03-04 ENCOUNTER — Ambulatory Visit (INDEPENDENT_AMBULATORY_CARE_PROVIDER_SITE_OTHER): Payer: Medicaid Other | Admitting: Nurse Practitioner

## 2023-03-04 VITALS — BP 129/81 | HR 73 | Ht 73.06 in | Wt 207.2 lb

## 2023-03-04 DIAGNOSIS — F419 Anxiety disorder, unspecified: Secondary | ICD-10-CM

## 2023-03-04 DIAGNOSIS — F17200 Nicotine dependence, unspecified, uncomplicated: Secondary | ICD-10-CM

## 2023-03-04 MED ORDER — CLONAZEPAM 0.5 MG PO TABS: ORAL_TABLET | ORAL | 0 refills | Status: DC

## 2023-03-04 MED ORDER — VARENICLINE TARTRATE (STARTER) 0.5 MG X 11 & 1 MG X 42 PO TBPK: ORAL_TABLET | ORAL | 0 refills | Status: DC

## 2023-03-04 NOTE — Patient Instructions (Signed)
[facility] Department Infectious Disease

## 2023-03-04 NOTE — Progress Notes (Signed)
   Subjective:    Patient ID: Randy Schmitt, male    DOB: 2001-05-23, 22 y.o.   MRN: [id number]  HPI Patient needs vaccines. Patient states he would like smoking patches and something anxiety for a plane trip to [place].  Will be taking his first plane ride to [place] in a couple weeks.  States the flight is 30 hours 1 way.  Patient has a history of anxiety which has been doing well.  Would like to have short-term medicine to take in case he gets anxious on flights.  Defers need for daily medication at this time.  Has struggled with issues in the past but doing much better. Would like medication to help him quit smoking.  Currently smokes 1-1-1/2 packs/day.  Denies vaping.  Occasional marijuana use a couple times a week.  Has thought about trying nicotine patches but not covered by insurance and cost prohibitive.   Review of Systems  Respiratory:  Negative for cough, chest tightness, shortness of breath and wheezing.   Cardiovascular:  Negative for chest pain.        Objective:   Physical Exam NAD.  Alert, oriented.  Calm affect.  Making good eye contact.  Speech clear.  Lungs clear.  Heart regular rate rhythm. Today's Vitals   03/04/23 1517  BP: 129/81  Pulse: 73  SpO2: 98%  Weight: 207 lb 3.2 oz (94 kg)  Height: 6' 1.06" (1.856 m)   Body mass index is 27.29 kg/m.        Assessment & Plan:   Problem List Items Addressed This Visit       Other   Anxiety   Tobacco use disorder - Primary   Meds ordered this encounter  Medications   Varenicline Tartrate, Starter, (CHANTIX STARTING MONTH PAK) 0.5 MG X 11 & 1 MG X 42 TBPK    Sig: Take one tab po qd for smoking cessation    Dispense:  53 each    Refill:  0    Order Specific Question:   Supervising Provider    Answer:   Lilyan Punt A [[id number]]   clonazePAM (KLONOPIN) 0.5 MG tablet    Sig: Take one tab po TID prn anxiety    Dispense:  6 tablet    Refill:  0    Order Specific Question:   Supervising Provider    Answer:    Lilyan Punt A [[id number]]   Trial of Chantix for smoking cessation.  Reviewed potential adverse effects.  Discontinue medication and contact office if any problems.  Usual course of medication is 2 to 3 months. Given low-dose Klonopin for his plane flight.  May take up to a total of 3 pills for either flight. Recheck here as needed.

## 2023-03-05 ENCOUNTER — Encounter: Payer: Self-pay | Admitting: Nurse Practitioner

## 2023-03-05 DIAGNOSIS — F17200 Nicotine dependence, unspecified, uncomplicated: Secondary | ICD-10-CM | POA: Insufficient documentation

## 2023-05-19 ENCOUNTER — Other Ambulatory Visit: Payer: Self-pay | Admitting: Nurse Practitioner

## 2023-06-02 ENCOUNTER — Ambulatory Visit (INDEPENDENT_AMBULATORY_CARE_PROVIDER_SITE_OTHER): Payer: MEDICAID | Admitting: Nurse Practitioner

## 2023-06-02 ENCOUNTER — Encounter: Payer: Self-pay | Admitting: Nurse Practitioner

## 2023-06-02 VITALS — BP 116/72 | HR 81 | Temp 98.5°F | Wt 201.0 lb

## 2023-06-02 DIAGNOSIS — J300 Vasomotor rhinitis: Secondary | ICD-10-CM | POA: Diagnosis not present

## 2023-06-02 DIAGNOSIS — J029 Acute pharyngitis, unspecified: Secondary | ICD-10-CM

## 2023-06-02 DIAGNOSIS — F17200 Nicotine dependence, unspecified, uncomplicated: Secondary | ICD-10-CM

## 2023-06-02 LAB — POCT RAPID STREP A (OFFICE): Rapid Strep A Screen: NEGATIVE

## 2023-06-02 MED ORDER — VARENICLINE TARTRATE 1 MG PO TABS: 1.0000 mg | ORAL_TABLET | Freq: Two times a day (BID) | ORAL | 1 refills | Status: DC

## 2023-06-02 MED ORDER — VARENICLINE TARTRATE (STARTER) 0.5 MG X 11 & 1 MG X 42 PO TBPK: ORAL_TABLET | ORAL | 0 refills | Status: DC

## 2023-06-02 NOTE — Progress Notes (Unsigned)
   Subjective:    Patient ID: Randy Schmitt, male    DOB: 2001/05/06, 22 y.o.   MRN: [id number]  HPI  Smoking cessation follow up - patient requests refill  Sore throat for past 3 days  Patient started Chantix at his last visit, states he took it for about a month and it worked extremely well.  Stopped the medicine too quickly and eventually started back smoking, would like to restart this time and take it longer.  Denies any adverse effects. Also complaints of sore throat for 3 to 4 days.  No cough.  Slight head congestion.  No ear pain.  No wheezing.  No sinus headache.  No difficulty swallowing.  Taking fluids well.  Review of Systems  Constitutional:  Negative for fever.  HENT:  Positive for congestion and sore throat. Negative for ear pain, sinus pain and trouble swallowing.   Respiratory:  Negative for cough, chest tightness, shortness of breath and wheezing.   Cardiovascular:  Negative for chest pain.       Objective:   Physical Exam NAD.  Alert, oriented.  TMs retracted bilaterally, no erythema.  Pharynx moderate erythema, no exudate noted.  Mucous membranes moist.  Neck supple with mild soft anterior cervical adenopathy.  Lungs clear.  Heart regular rate rhythm. Results for orders placed or performed in visit on 06/02/23  POCT rapid strep A  Result Value Ref Range   Rapid Strep A Screen Negative Negative   Today's Vitals   06/02/23 1524  BP: 116/72  Pulse: 81  Temp: 98.5 F (36.9 C)  SpO2: 97%  Weight: 201 lb (91.2 kg)   Body mass index is 26.48 kg/m.        Assessment & Plan:   Problem List Items Addressed This Visit       Respiratory   Vasomotor rhinitis     Other   Tobacco use disorder   Other Visit Diagnoses     Acute pharyngitis, unspecified etiology    -  Primary   Relevant Orders   POCT rapid strep A (Completed)   Culture, Group A Strep      Throat culture pending. Start OTC steroid nasal spray as directed for congestion. Meds ordered  this encounter  Medications   Varenicline Tartrate, Starter, 0.5 MG X 11 & 1 MG X 42 TBPK    Sig: TAKE AS DIRECTED ON PACKAGE FOR SMOKING CESSATION    Dispense:  53 each    Refill:  0   varenicline (CHANTIX CONTINUING MONTH PAK) 1 MG tablet    Sig: Take 1 tablet (1 mg total) by mouth 2 (two) times daily.    Dispense:  56 tablet    Refill:  1    Please fill after the starter pack. Thanks.    Order Specific Question:   Supervising Provider    Answer:   Lilyan Punt A [[id number]]   Restart Chantix as directed.  Recommend a minimum of 2 to 3 months for smoking cessation, call back if he needs to stay on the medication longer. Warning signs reviewed.  Recheck if current symptoms worsen or persist.

## 2023-06-02 NOTE — Patient Instructions (Signed)
Flonase or Nasacort.

## 2023-06-03 ENCOUNTER — Encounter: Payer: Self-pay | Admitting: Nurse Practitioner

## 2023-06-04 LAB — CULTURE, GROUP A STREP: Strep A Culture: NEGATIVE

## 2023-10-26 ENCOUNTER — Ambulatory Visit (INDEPENDENT_AMBULATORY_CARE_PROVIDER_SITE_OTHER): Payer: MEDICAID | Admitting: Family Medicine

## 2023-10-26 VITALS — BP 124/76 | HR 58 | Ht 73.06 in | Wt 209.8 lb

## 2023-10-26 DIAGNOSIS — J019 Acute sinusitis, unspecified: Secondary | ICD-10-CM

## 2023-10-26 DIAGNOSIS — L700 Acne vulgaris: Secondary | ICD-10-CM

## 2023-10-26 MED ORDER — AMOXICILLIN 500 MG PO TABS: 500.0000 mg | ORAL_TABLET | Freq: Three times a day (TID) | ORAL | 1 refills | Status: DC

## 2023-10-26 MED ORDER — DOXYCYCLINE HYCLATE 100 MG PO TABS: 100.0000 mg | ORAL_TABLET | Freq: Every day | ORAL | 5 refills | Status: DC

## 2023-10-26 NOTE — Progress Notes (Signed)
   Subjective:    Patient ID: Randy Schmitt, male    DOB: 01-01-2001, 22 y.o.   MRN: [id number]  HPI patient with head congestion drainage coughing sinus pressure not feeling good symptoms over the past 5 to 6 days worsening.  Patient denies high fever denies muscle aches no wheezing no difficulty breathing  Patient with acne on his back papular pustule sometimes in the lower midportion of the back sometimes upper back does not on the face    Review of Systems     Objective:   Physical Exam Acne noted pustular on his back mild not severe scattered areas on the upper shoulders mild amount in the middle of the back toward lower thoracic  Lungs are clear respiratory rate normal heart is regular mild sinus tenderness throat normal       Assessment & Plan:   Acute rhinosinusitis amoxicillin 3 times da Ily 7 days warning signs discussed follow-up if ongoing troubles  Mild acne-we did discuss just OTC topical versus oral antibiotics patient chooses oral antibiotics cautioned regarding side effects and sun exposure Doxycycline 1 a day Follow-up within 4 to 5 months for wellness and recheck of back If having any problems with the medicine stop Also if you are going on vacation such as the beach consider stopping the medicine while on vacation Proper way to take the medication discussed

## 2023-11-18 ENCOUNTER — Encounter: Payer: Self-pay | Admitting: Family Medicine

## 2023-11-18 ENCOUNTER — Ambulatory Visit (INDEPENDENT_AMBULATORY_CARE_PROVIDER_SITE_OTHER): Payer: MEDICAID | Admitting: Family Medicine

## 2023-11-18 VITALS — BP 126/76 | HR 69 | Temp 98.4°F | Ht 73.06 in | Wt 211.0 lb

## 2023-11-18 DIAGNOSIS — Z0000 Encounter for general adult medical examination without abnormal findings: Secondary | ICD-10-CM

## 2023-11-18 DIAGNOSIS — K120 Recurrent oral aphthae: Secondary | ICD-10-CM | POA: Diagnosis not present

## 2023-11-18 DIAGNOSIS — L739 Follicular disorder, unspecified: Secondary | ICD-10-CM | POA: Diagnosis not present

## 2023-11-18 DIAGNOSIS — Z0001 Encounter for general adult medical examination with abnormal findings: Secondary | ICD-10-CM | POA: Diagnosis not present

## 2023-11-18 DIAGNOSIS — Z13220 Encounter for screening for lipoid disorders: Secondary | ICD-10-CM

## 2023-11-18 DIAGNOSIS — Z113 Encounter for screening for infections with a predominantly sexual mode of transmission: Secondary | ICD-10-CM

## 2023-11-18 DIAGNOSIS — Z114 Encounter for screening for human immunodeficiency virus [HIV]: Secondary | ICD-10-CM

## 2023-11-18 NOTE — Progress Notes (Signed)
   Subjective:    Patient ID: Randy Schmitt, male    DOB: 19-Nov-2000, 23 y.o.   MRN: [id number]  HPI  The patient comes in today for a wellness visit.    A review of their health history was completed.  A review of medications was also completed.  Any needed refills; none  Eating habits: fair  Falls/  MVA accidents in past few months: no  Regular exercise: walk/work  Specialist pt sees on regular basis: none  Preventative health issues were discussed.   Additional concerns: none   Review of Systems     Objective:   Physical Exam  General-in no acute distress Eyes-no discharge Lungs-respiratory rate normal, CTA CV-no murmurs,RRR Extremities skin warm dry no edema Neuro grossly normal Behavior normal, alert Patient has aphthous ulcer under his tongue been present for 3 to 4 days Patient has 2 small bumps on the penile shaft does not appear to be herpes does not appear to be genital warts Not painful Patient states he just noticed that previously      Assessment & Plan:  1. Well adult exam (Primary) Adult wellness-complete.wellness physical was conducted today. Importance of diet and exercise were discussed in detail.  Importance of stress reduction and healthy living were discussed.  In addition to this a discussion regarding safety was also covered.  We also reviewed over immunizations and gave recommendations regarding current immunization needed for age.   In addition to this additional areas were also touched on including: Preventative health exams needed:  Colonoscopy not indicated  Patient was advised yearly wellness exam  - CBC with Differential - Comprehensive metabolic panel - Lipid Panel - RPR - HIV antibody (with reflex) - HSV(herpes simplex vrs) 1+2 ab-IgG - HSV 1 and 2 Ab, IgG  2. Screening for HIV (human immunodeficiency virus) Screening - HIV antibody (with reflex)  3. Screen for STD (sexually transmitted disease) Screening,  patient denies any STD symptoms has 1 partner but would like to be screened - RPR - HIV antibody (with reflex) - HSV(herpes simplex vrs) 1+2 ab-IgG - HSV 1 and 2 Ab, IgG  4. Folliculitis On the penile shaft does not need antibiotics if the bumps persist will consult dermatology  5. Aphthous ulcer of mouth Should heal up over the next several days information given follow-up if ongoing troubles  6. Screening, lipid Screening - Lipid Panel

## 2023-12-15 ENCOUNTER — Encounter: Payer: Self-pay | Admitting: Family Medicine

## 2023-12-15 LAB — CBC WITH DIFFERENTIAL/PLATELET
Basophils Absolute: 0 10*3/uL (ref 0.0–0.2)
Basos: 0 %
EOS (ABSOLUTE): 0 10*3/uL (ref 0.0–0.4)
Eos: 1 %
Hematocrit: 41.6 % (ref 37.5–51.0)
Hemoglobin: 13.8 g/dL (ref 13.0–17.7)
Immature Grans (Abs): 0 10*3/uL (ref 0.0–0.1)
Immature Granulocytes: 0 %
Lymphocytes Absolute: 1.9 10*3/uL (ref 0.7–3.1)
Lymphs: 36 %
MCH: 27.3 pg (ref 26.6–33.0)
MCHC: 33.2 g/dL (ref 31.5–35.7)
MCV: 82 fL (ref 79–97)
Monocytes Absolute: 0.4 10*3/uL (ref 0.1–0.9)
Monocytes: 7 %
Neutrophils Absolute: 2.9 10*3/uL (ref 1.4–7.0)
Neutrophils: 56 %
Platelets: 226 10*3/uL (ref 150–450)
RBC: 5.05 x10E6/uL (ref 4.14–5.80)
RDW: 13.4 % (ref 11.6–15.4)
WBC: 5.2 10*3/uL (ref 3.4–10.8)

## 2023-12-15 LAB — COMPREHENSIVE METABOLIC PANEL
ALT: 13 IU/L (ref 0–44)
AST: 25 IU/L (ref 0–40)
Albumin: 4.9 g/dL (ref 4.3–5.2)
Alkaline Phosphatase: 96 IU/L (ref 51–125)
BUN/Creatinine Ratio: 22 — ABNORMAL HIGH (ref 9–20)
BUN: 17 mg/dL (ref 6–20)
Bilirubin Total: 0.2 mg/dL (ref 0.0–1.2)
CO2: 24 mmol/L (ref 20–29)
Calcium: 9.7 mg/dL (ref 8.7–10.2)
Chloride: 104 mmol/L (ref 96–106)
Creatinine, Ser: 0.78 mg/dL (ref 0.76–1.27)
Globulin, Total: 2.8 g/dL (ref 1.5–4.5)
Glucose: 100 mg/dL — ABNORMAL HIGH (ref 70–99)
Potassium: 4.3 mmol/L (ref 3.5–5.2)
Sodium: 142 mmol/L (ref 134–144)
Total Protein: 7.7 g/dL (ref 6.0–8.5)
eGFR: 131 mL/min/{1.73_m2} (ref >59)

## 2023-12-15 LAB — HSV 1 AND 2 AB, IGG
HSV 1 Glycoprotein G Ab, IgG: NONREACTIVE
HSV 2 IgG, Type Spec: NONREACTIVE

## 2023-12-15 LAB — HIV ANTIBODY (ROUTINE TESTING W REFLEX): HIV Screen 4th Generation wRfx: NONREACTIVE

## 2023-12-15 LAB — LIPID PANEL
Chol/HDL Ratio: 2.3 ratio (ref 0.0–5.0)
Cholesterol, Total: 140 mg/dL (ref 100–199)
HDL: 61 mg/dL (ref >39)
LDL Chol Calc (NIH): 65 mg/dL (ref 0–99)
Triglycerides: 72 mg/dL (ref 0–149)
VLDL Cholesterol Cal: 14 mg/dL (ref 5–40)

## 2023-12-15 LAB — RPR: RPR Ser Ql: NONREACTIVE

## 2024-01-22 ENCOUNTER — Emergency Department (HOSPITAL_COMMUNITY): Payer: MEDICAID

## 2024-01-22 ENCOUNTER — Emergency Department (HOSPITAL_COMMUNITY)
Admission: EM | Admit: 2024-01-22 | Discharge: 2024-01-22 | Disposition: A | Discharge status: Home / Self Care | Payer: MEDICAID | Attending: Emergency Medicine | Admitting: Emergency Medicine

## 2024-01-22 ENCOUNTER — Other Ambulatory Visit: Payer: Self-pay

## 2024-01-22 ENCOUNTER — Encounter (HOSPITAL_COMMUNITY): Payer: Self-pay | Admitting: *Deleted

## 2024-01-22 DIAGNOSIS — S52572A Other intraarticular fracture of lower end of left radius, initial encounter for closed fracture: Secondary | ICD-10-CM | POA: Insufficient documentation

## 2024-01-22 DIAGNOSIS — Z720 Tobacco use: Secondary | ICD-10-CM | POA: Insufficient documentation

## 2024-01-22 DIAGNOSIS — W3182XA Contact with other commercial machinery, initial encounter: Secondary | ICD-10-CM | POA: Insufficient documentation

## 2024-01-22 DIAGNOSIS — Z23 Encounter for immunization: Secondary | ICD-10-CM | POA: Diagnosis not present

## 2024-01-22 DIAGNOSIS — Y990 Civilian activity done for income or pay: Secondary | ICD-10-CM | POA: Insufficient documentation

## 2024-01-22 DIAGNOSIS — S00211A Abrasion of right eyelid and periocular area, initial encounter: Secondary | ICD-10-CM | POA: Diagnosis not present

## 2024-01-22 DIAGNOSIS — S52502A Unspecified fracture of the lower end of left radius, initial encounter for closed fracture: Secondary | ICD-10-CM

## 2024-01-22 DIAGNOSIS — M25532 Pain in left wrist: Secondary | ICD-10-CM | POA: Diagnosis present

## 2024-01-22 DIAGNOSIS — S40812A Abrasion of left upper arm, initial encounter: Secondary | ICD-10-CM | POA: Insufficient documentation

## 2024-01-22 MED ORDER — OXYCODONE-ACETAMINOPHEN 5-325 MG PO TABS: 1.0000 | ORAL_TABLET | Freq: Four times a day (QID) | ORAL | 0 refills | Status: DC | PRN

## 2024-01-22 MED ORDER — OXYCODONE-ACETAMINOPHEN 5-325 MG PO TABS: 1.0000 | ORAL_TABLET | Freq: Three times a day (TID) | ORAL | 0 refills | Status: DC | PRN

## 2024-01-22 MED ORDER — HYDROCODONE-ACETAMINOPHEN 5-325 MG PO TABS
1.0000 | ORAL_TABLET | Freq: Once | ORAL | Status: AC
  Administered 2024-01-22: 1 via ORAL
  Filled 2024-01-22: qty 1

## 2024-01-22 MED ORDER — TETANUS-DIPHTH-ACELL PERTUSSIS 5-2.5-18.5 LF-MCG/0.5 IM SUSY
0.5000 mL | PREFILLED_SYRINGE | Freq: Once | INTRAMUSCULAR | Status: AC
  Administered 2024-01-22: 0.5 mL via INTRAMUSCULAR
  Filled 2024-01-22: qty 0.5

## 2024-01-22 NOTE — Discharge Instructions (Addendum)
 You have a wrist fracture, call Dr Merrie Abed for follow up. Keep sling and splint on.  Pain-- 650 mg tylenol  every 6 hours and ibuprofen  400mg  every 6 hours. Use ice over area of pain. Take Oxycodone  for breakthrough pain.  Return if numbness, tingling sensation in hand, severe swelling or worsening pain.

## 2024-01-22 NOTE — ED Provider Notes (Signed)
 [place] EMERGENCY DEPARTMENT AT [facility] Provider Note   CSN: [id number] Arrival date & time: 01/22/24  1539     History  Chief Complaint  Patient presents with   Wrist Pain    Randy Schmitt is a 23 y.o. male with past medical history of tobacco use disorder, alcohol use in remission, anxiety is presenting to emergency room with complaint of fall.  Patient reports when he was at work working on some machinery the equipment slipped and threw him off.  He reports that he fell approximately 10 feet landing directly onto his left arm.  After that he rolled scraping his left arm and knee.  He also hit left eyebrow.  He does have left eyebrow abrasion but has stopped bleeding on its own.  He is not sure when he had last tetanus shot and given age will update here.  Primarily concerned about left wrist swelling and pain.  He has no open laceration over this area.  He is able to wiggle fingers and is neurovascularly intact.  When he fell he did not hit his head or lose consciousness.  He has no altered mental status blurry vision change in vision.   Wrist Pain       Home Medications Prior to Admission medications   Not on File      Allergies    Patient has no known allergies.    Review of Systems   Review of Systems  Musculoskeletal:  Positive for arthralgias.  Skin:  Positive for wound.    Physical Exam Updated Vital Signs BP (!) 142/86   Pulse 83   Temp 99 F (37.2 C) (Oral)   Resp 20   Ht 6\' 1"  (1.854 m)   Wt 95.3 kg   SpO2 97%   BMI 27.71 kg/m  Physical Exam Vitals and nursing note reviewed.  Constitutional:      General: He is not in acute distress.    Appearance: He is not toxic-appearing.  HENT:     Head: Normocephalic and atraumatic.  Eyes:     General: No scleral icterus.    Conjunctiva/sclera: Conjunctivae normal.  Neck:     Comments: No cervical thoracic or lumbar midline tenderness.  No deformity. Cardiovascular:     Rate and  Rhythm: Normal rate and regular rhythm.     Pulses: Normal pulses.     Heart sounds: Normal heart sounds.  Pulmonary:     Effort: Pulmonary effort is normal. No respiratory distress.     Breath sounds: Normal breath sounds.  Abdominal:     General: Abdomen is flat. Bowel sounds are normal.     Palpations: Abdomen is soft.     Tenderness: There is no abdominal tenderness.  Musculoskeletal:     Comments: Left distal forearm swelling.  No obvious deformity.  He is neurovascularly intact.  No open laceration over this area.  Abrasion to above right eyebrow.  Superficial abrasions to upper arm.  Skin:    General: Skin is warm and dry.     Findings: No lesion.  Neurological:     General: No focal deficit present.     Mental Status: He is alert and oriented to person, place, and time. Mental status is at baseline.     Comments: Moving extremities without difficulty excluding left wrist and hand. Alert and oriented.  No slurred speech.  Sensation intact. Lower extremity sensation and strength intact.      ED Results / Procedures / Treatments  Labs (all labs ordered are listed, but only abnormal results are displayed) Labs Reviewed - No data to display  EKG None  Radiology DG Wrist Complete Left Result Date: 01/22/2024 CLINICAL DATA:  Injury at work, left wrist pain EXAM: LEFT WRIST - COMPLETE 3+ VIEW COMPARISON:  04/06/2022 FINDINGS: Frontal, oblique, lateral, and ulnar deviated views of the left wrist are obtained. There is a comminuted intra-articular fracture of the distal left radius, with mild impaction and dorsal angulation at the fracture site. Minimally displaced fracture at the tip of the ulnar styloid is also noted. Stable ORIF of the scaphoid. 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 Diffuse soft tissue swelling. IMPRESSION: 1. Acute comminuted intra-articular fracture of the distal left radius, with impaction and dorsal angulation at the fracture site. 2.  Minimally displaced fracture of the tip of the ulnar styloid. 3. Extensive soft tissue swelling. 4. Stable postsurgical changes of the scaphoid. Electronically Signed   By: Bobbye Burrow M.D.   On: 01/22/2024 17:35    Procedures Procedures    Medications Ordered in ED Medications - No data to display  ED Course/ Medical Decision Making/ A&P                                 Medical Decision Making Amount and/or Complexity of Data Reviewed Radiology: ordered.  Risk Prescription drug management.   This patient presents to the ED for concern of left wrist pain, this involves an extensive number of treatment options, and is a complaint that carries with it a high risk of complications and morbidity.  The differential diagnosis includes    Imaging Studies ordered:  I ordered imaging studies including left wrist  I independently visualized and interpreted imaging which showed acute intra-articular fracture of the distal left radius and minimally displaced, styloid fracture with extensive soft tissue swelling.  Stable course surgical changes of this scaphoid.  I agree with the radiologist interpretation   Cardiac Monitoring: / EKG:  The patient was maintained on a cardiac monitor.     Problem List / ED Course / Critical interventions / Medication management  Reporting to emergency room after a fall injuring left wrist.  He is neurovascularly intact.  Compartments are soft however he does have soft tissue swelling surrounding this area without obvious deformity.  No open skin around fracture. Dose not have updated tetanus shot, will give. Will give pain control. No other associated injuries during fall.  Has abrasion to right eyebrow.  Otherwise no obvious trauma to head no ecchymosis or suspected fracture.  He did not lose consciousness and is not on blood thinner.  Has had normal mentation.  Has no cervical neck tenderness and no radicular symptoms.  Thus do not feel that head or neck  imaging is necessary at this time.  He has no chest pain no shortness of breath and lungs clear to auscultation.  No obvious bruising over abdomen.  Ambulatory with steady gait. Will obtained x-ray of wrist and reassess. Feeling better after medications, placed in sugar tung and splint. Has Ortho follow up from prior wrist surgery.  I ordered medication including Norco for pain control, tdap updated.  Reevaluation of the patient after these medicines showed that the patient improved I have reviewed the patients home medicines and have made adjustments as needed   Plan  F/u w/ PCP in 2-3d to ensure resolution of sx.  Patient was given return  precautions. Patient stable for discharge at this time.  Patient educated on sx/dx and verbalized understanding of plan. Return to ER w/ new or worsening sx.          Final Clinical Impression(s) / ED Diagnoses Final diagnoses:  Closed fracture of distal end of left radius, unspecified fracture morphology, initial encounter    Rx / DC Orders ED Discharge Orders          Ordered    oxyCODONE -acetaminophen  (PERCOCET/ROXICET) 5-325 MG tablet  Every 8 hours PRN        01/22/24 1824              Eudora Heron, PA-C 01/22/24 1827    Sueellen Emery, MD 01/22/24 [phone]

## 2024-01-22 NOTE — ED Notes (Signed)
 Pt given pre-pack of Percocet pain medication per hard script. Pt given his prepack of narcotic medication with 6 pills inside, charge RN, pt, and float RN, Lenise Quince, signed hard script as witness to pt receiving medication

## 2024-01-22 NOTE — ED Triage Notes (Signed)
 Pt states a machine flipped and threw pt back, pt tried to catch himself.  Pt with swelling to left wrist, pt states he has an old injury to same wrist. Pt with abrasion to left eyebrow, denies LOC. Left knee pain, right elbow, HA

## 2024-01-24 MED FILL — Oxycodone w/ Acetaminophen Tab 5-325 MG: ORAL | Qty: 6 | Status: AC

## 2024-01-27 ENCOUNTER — Encounter (HOSPITAL_BASED_OUTPATIENT_CLINIC_OR_DEPARTMENT_OTHER): Payer: Self-pay | Admitting: Orthopedic Surgery

## 2024-01-27 ENCOUNTER — Other Ambulatory Visit: Payer: Self-pay

## 2024-02-01 NOTE — Anesthesia Preprocedure Evaluation (Signed)
 Anesthesia Evaluation  Patient identified by MRN, date of birth, ID band Patient awake    Reviewed: Allergy & Precautions, NPO status , Patient's Chart, lab work & pertinent test results  History of Anesthesia Complications Negative for: history of anesthetic complications  Airway Mallampati: II  TM Distance: >3 FB Neck ROM: Full    Dental no notable dental hx.    Pulmonary Current Smoker and Patient abstained from smoking.   Pulmonary exam normal        Cardiovascular negative cardio ROS Normal cardiovascular exam     Neuro/Psych   Anxiety        GI/Hepatic negative GI ROS, Neg liver ROS,,,  Endo/Other  negative endocrine ROS    Renal/GU negative Renal ROS  negative genitourinary   Musculoskeletal Left distal radius fracture   Abdominal   Peds  Hematology negative hematology ROS (+)   Anesthesia Other Findings Day of surgery medications reviewed with patient.  Reproductive/Obstetrics                              Anesthesia Physical Anesthesia Plan  ASA: 2  Anesthesia Plan: MAC and Regional   Post-op Pain Management: Minimal or no pain anticipated   Induction:   PONV Risk Score and Plan: 0 and Treatment may vary due to age or medical condition, Propofol  infusion, Ondansetron  and Midazolam   Airway Management Planned: Natural Airway and Simple Face Mask  Additional Equipment: None  Intra-op Plan:   Post-operative Plan:   Informed Consent: I have reviewed the patients History and Physical, chart, labs and discussed the procedure including the risks, benefits and alternatives for the proposed anesthesia with the patient or authorized representative who has indicated his/her understanding and acceptance.       Plan Discussed with: CRNA  Anesthesia Plan Comments:         Anesthesia Quick Evaluation

## 2024-02-02 ENCOUNTER — Other Ambulatory Visit: Payer: Self-pay

## 2024-02-02 ENCOUNTER — Ambulatory Visit (HOSPITAL_BASED_OUTPATIENT_CLINIC_OR_DEPARTMENT_OTHER): Payer: MEDICAID

## 2024-02-02 ENCOUNTER — Ambulatory Visit (HOSPITAL_BASED_OUTPATIENT_CLINIC_OR_DEPARTMENT_OTHER): Payer: MEDICAID | Admitting: Anesthesiology

## 2024-02-02 ENCOUNTER — Encounter (HOSPITAL_BASED_OUTPATIENT_CLINIC_OR_DEPARTMENT_OTHER)
Admission: RE | Disposition: A | Discharge status: Home / Self Care | Payer: Self-pay | Source: Home / Self Care | Attending: Orthopedic Surgery

## 2024-02-02 ENCOUNTER — Ambulatory Visit (HOSPITAL_BASED_OUTPATIENT_CLINIC_OR_DEPARTMENT_OTHER)
Admission: RE | Admit: 2024-02-02 | Discharge: 2024-02-02 | Disposition: A | Discharge status: Home / Self Care | Payer: MEDICAID | Attending: Orthopedic Surgery | Admitting: Orthopedic Surgery

## 2024-02-02 ENCOUNTER — Encounter (HOSPITAL_BASED_OUTPATIENT_CLINIC_OR_DEPARTMENT_OTHER): Payer: Self-pay | Admitting: Orthopedic Surgery

## 2024-02-02 DIAGNOSIS — F17210 Nicotine dependence, cigarettes, uncomplicated: Secondary | ICD-10-CM | POA: Insufficient documentation

## 2024-02-02 DIAGNOSIS — S52572A Other intraarticular fracture of lower end of left radius, initial encounter for closed fracture: Secondary | ICD-10-CM | POA: Insufficient documentation

## 2024-02-02 DIAGNOSIS — X58XXXA Exposure to other specified factors, initial encounter: Secondary | ICD-10-CM | POA: Insufficient documentation

## 2024-02-02 HISTORY — PX: OPEN REDUCTION INTERNAL FIXATION (ORIF) DISTAL RADIAL FRACTURE: SHX5989

## 2024-02-02 SURGERY — OPEN REDUCTION INTERNAL FIXATION (ORIF) DISTAL RADIUS FRACTURE
Anesthesia: Monitor Anesthesia Care | Site: Wrist | Laterality: Left

## 2024-02-02 MED ORDER — FENTANYL CITRATE (PF) 100 MCG/2ML IJ SOLN
100.0000 ug | Freq: Once | INTRAMUSCULAR | Status: AC
  Administered 2024-02-02: 100 ug via INTRAVENOUS

## 2024-02-02 MED ORDER — OXYCODONE HCL 5 MG/5ML PO SOLN: 5.0000 mg | Freq: Once | ORAL | Status: DC | PRN

## 2024-02-02 MED ORDER — MIDAZOLAM HCL 2 MG/2ML IJ SOLN
INTRAMUSCULAR | Status: AC
  Filled 2024-02-02: qty 2

## 2024-02-02 MED ORDER — OXYCODONE HCL 5 MG PO TABS: 5.0000 mg | ORAL_TABLET | Freq: Four times a day (QID) | ORAL | 0 refills | Status: AC | PRN

## 2024-02-02 MED ORDER — ONDANSETRON HCL 4 MG/2ML IJ SOLN
INTRAMUSCULAR | Status: DC | PRN
  Administered 2024-02-02: 4 mg via INTRAVENOUS

## 2024-02-02 MED ORDER — MIDAZOLAM HCL 2 MG/2ML IJ SOLN
2.0000 mg | Freq: Once | INTRAMUSCULAR | Status: AC
  Administered 2024-02-02: 2 mg via INTRAVENOUS

## 2024-02-02 MED ORDER — FENTANYL CITRATE (PF) 100 MCG/2ML IJ SOLN
INTRAMUSCULAR | Status: AC
  Filled 2024-02-02: qty 2

## 2024-02-02 MED ORDER — DEXMEDETOMIDINE HCL IN NACL 80 MCG/20ML IV SOLN
INTRAVENOUS | Status: DC | PRN
  Administered 2024-02-02: 4 ug via INTRAVENOUS
  Administered 2024-02-02: 8 ug via INTRAVENOUS

## 2024-02-02 MED ORDER — OXYCODONE HCL 5 MG PO TABS: 5.0000 mg | ORAL_TABLET | Freq: Once | ORAL | Status: DC | PRN

## 2024-02-02 MED ORDER — LACTATED RINGERS IV SOLN: INTRAVENOUS | Status: DC

## 2024-02-02 MED ORDER — BUPIVACAINE-EPINEPHRINE (PF) 0.5% -1:200000 IJ SOLN
INTRAMUSCULAR | Status: DC | PRN
  Administered 2024-02-02: 30 mL via PERINEURAL

## 2024-02-02 MED ORDER — CEFAZOLIN SODIUM-DEXTROSE 2-4 GM/100ML-% IV SOLN
INTRAVENOUS | Status: AC
  Filled 2024-02-02: qty 100

## 2024-02-02 MED ORDER — CLONIDINE HCL (ANALGESIA) 100 MCG/ML EP SOLN
EPIDURAL | Status: DC | PRN
  Administered 2024-02-02: 100 ug

## 2024-02-02 MED ORDER — CEFAZOLIN SODIUM-DEXTROSE 2-4 GM/100ML-% IV SOLN
2.0000 g | INTRAVENOUS | Status: AC
  Administered 2024-02-02: 2 g via INTRAVENOUS

## 2024-02-02 MED ORDER — ACETAMINOPHEN 500 MG PO TABS
1000.0000 mg | ORAL_TABLET | Freq: Once | ORAL | Status: AC
  Administered 2024-02-02: 1000 mg via ORAL

## 2024-02-02 MED ORDER — FENTANYL CITRATE (PF) 100 MCG/2ML IJ SOLN
25.0000 ug | INTRAMUSCULAR | Status: DC | PRN
Start: 2024-02-02 — End: 2024-02-02

## 2024-02-02 MED ORDER — DROPERIDOL 2.5 MG/ML IJ SOLN: 0.6250 mg | Freq: Once | INTRAMUSCULAR | Status: DC | PRN

## 2024-02-02 MED ORDER — ACETAMINOPHEN 500 MG PO TABS
ORAL_TABLET | ORAL | Status: AC
  Filled 2024-02-02: qty 2

## 2024-02-02 MED ORDER — PROPOFOL 10 MG/ML IV BOLUS
INTRAVENOUS | Status: DC | PRN
  Administered 2024-02-02: 10 mg via INTRAVENOUS

## 2024-02-02 MED ORDER — PROPOFOL 500 MG/50ML IV EMUL
INTRAVENOUS | Status: DC | PRN
  Administered 2024-02-02: 100 ug/kg/min via INTRAVENOUS

## 2024-02-02 SURGICAL SUPPLY — 47 items
BIT DRILL SOLID 2.0X40MM (BIT) IMPLANT
BIT DRILL SOLID 2.5X40MM (BIT) IMPLANT
BLADE SURG 15 STRL LF DISP TIS (BLADE) ×1 IMPLANT
BNDG ELASTIC 3INX 5YD STR LF (GAUZE/BANDAGES/DRESSINGS) ×1 IMPLANT
BNDG ESMARK 4X9 LF (GAUZE/BANDAGES/DRESSINGS) ×1 IMPLANT
BNDG GAUZE DERMACEA FLUFF 4 (GAUZE/BANDAGES/DRESSINGS) ×1 IMPLANT
BNDG PLASTER X FAST 3X3 WHT LF (CAST SUPPLIES) ×10 IMPLANT
CHLORAPREP W/TINT 26 (MISCELLANEOUS) ×1 IMPLANT
CORD BIPOLAR FORCEPS 12FT (ELECTRODE) ×1 IMPLANT
COVER BACK TABLE 60X90IN (DRAPES) ×1 IMPLANT
CUFF TOURN SGL QUICK 18X4 (TOURNIQUET CUFF) ×1 IMPLANT
CUFF TRNQT CYL 24X4X16.5-23 (TOURNIQUET CUFF) IMPLANT
DRAPE EXTREMITY T 121X128X90 (DISPOSABLE) ×1 IMPLANT
DRAPE OEC MINIVIEW 54X84 (DRAPES) ×1 IMPLANT
DRAPE SURG 17X23 STRL (DRAPES) ×1 IMPLANT
GAUZE SPONGE 4X4 12PLY STRL (GAUZE/BANDAGES/DRESSINGS) ×1 IMPLANT
GAUZE XEROFORM 1X8 LF (GAUZE/BANDAGES/DRESSINGS) IMPLANT
GLOVE BIO SURGEON STRL SZ7 (GLOVE) ×1 IMPLANT
GOWN STRL REUS W/ TWL LRG LVL3 (GOWN DISPOSABLE) ×2 IMPLANT
GUIDE AIMING 1.5MM (WIRE) IMPLANT
NDL HYPO 25X1 1.5 SAFETY (NEEDLE) IMPLANT
NEEDLE HYPO 25X1 1.5 SAFETY (NEEDLE) IMPLANT
NS IRRIG 1000ML POUR BTL (IV SOLUTION) ×1 IMPLANT
PACK BASIN DAY SURGERY FS (CUSTOM PROCEDURE TRAY) ×1 IMPLANT
PAD CAST 3X4 CTTN HI CHSV (CAST SUPPLIES) ×1 IMPLANT
PEG GEMINUS SMOOTH LOCK 2.0X19 (Peg) IMPLANT
PEG GEMINUS SMOOTH LOCK 2.0X22 (Peg) IMPLANT
PEG GEMINUS THRD TPNL 2.7X26 (Peg) IMPLANT
PEG NON LOCK THREAD 2.7X24MM (Screw) IMPLANT
PEG SMOOTH LOCK 2.0X18 (Screw) IMPLANT
PEG SMOOTH LOCKING 20MM (Peg) IMPLANT
PEG SMOOTH LOCKING 21MM (Peg) IMPLANT
PLATE GEMINUS DIST RAD LTW 4HL (Plate) IMPLANT
SCREW GEMINUS PANL 3.5X13 (Screw) IMPLANT
SCREW POLY NON LOCK 3.5MMX12MM (Screw) IMPLANT
SCREWDRIVER SURG ST 2 (INSTRUMENTS) IMPLANT
SHEET MEDIUM DRAPE 40X70 STRL (DRAPES) ×1 IMPLANT
SLEEVE SCD COMPRESS KNEE MED (STOCKING) IMPLANT
SPLINT FIBERGLASS 4X30 (CAST SUPPLIES) IMPLANT
SUT ETHILON 4 0 PS 2 18 (SUTURE) ×1 IMPLANT
SUT MNCRL AB 3-0 PS2 18 (SUTURE) ×1 IMPLANT
SUT VIC AB 4-0 PS2 18 (SUTURE) IMPLANT
SYR BULB EAR ULCER 3OZ GRN STR (SYRINGE) ×1 IMPLANT
SYR CONTROL 10ML LL (SYRINGE) IMPLANT
TOWEL GREEN STERILE FF (TOWEL DISPOSABLE) ×2 IMPLANT
UNDERPAD 30X36 HEAVY ABSORB (UNDERPADS AND DIAPERS) ×1 IMPLANT
WIRE FIX 1.5 STD TIP (WIRE) IMPLANT

## 2024-02-02 NOTE — Op Note (Signed)
 Date of Surgery: 02/02/2024  INDICATIONS: Patient is a 23 y.o.-year-old male with a closed, left intra-articular distal radius fracture.  He was seen in the office shortly after his injury.  We reviewed the nature of distal radius fractures at length including both conservative and surgical treatment options.  We reviewed the pros and cons of each approach.  After our discussion, patient and his family requested open reduction and internal fixation rather than conservative management.  Risks, benefits, and alternatives to surgery were again discussed with the patient in the preoperative area. The patient wishes to proceed with surgery.  Informed consent was signed after our discussion.   PREOPERATIVE DIAGNOSIS:  1. Closed, left, intra-articular distal radius fracture, 3+ fragments  POSTOPERATIVE DIAGNOSIS: Same.  PROCEDURE:  Open reduction and internal fixation of left, intra-articular distal radius fracture, >3 fragments   SURGEON: Auburn Blaze, M.D.  ASSIST: None  ANESTHESIA:  Regional, MAC  IV FLUIDS AND URINE: See anesthesia.  ESTIMATED BLOOD LOSS: 10 mL.  IMPLANTS:  Implant Name Type Inv. Item Serial No. Manufacturer Lot No. LRB No. Used Action  PLATE GEMINUS DIST RAD LTW 4HL - [id number] Plate PLATE GEMINUS DIST RAD LTW 4HL  SKELETAL [employer]YNAM[employer]  Left 1 Implanted  SCREW POLY NON LOCK 3.9JYN82NF - [id number] Screw SCREW POLY NON LOCK 3.4ONG29BM  SKELETAL DYNAMICS  Left 2 Implanted  SCREW GEMINUS PANL 3.5X13 - [id number] Screw SCREW GEMINUS PANL 3.5X13  SKELETAL DYNAMICS  Left 2 Implanted  PEG SMOOTH LOCK 2.0X18 - [id number] Screw PEG SMOOTH LOCK 2.0X18  SKELETAL [employer]YNAM[employer]  Left 1 Implanted  PEG GEMINUS SMOOTH LOCK 2.0X19 - [id number] Peg PEG GEMINUS SMOOTH LOCK 2.0X19  SKELETAL DYNAMICS  Left 1 Implanted  PEG SMOOTH LOCKING - [id number] Peg PEG SMOOTH LOCKING  SKELETAL DYNAMICS  Left 3 Implanted  PEG SMOOTH LOCKING - [id number] Peg PEG SMOOTH LOCKING   SKELETAL DYNAMICS  Left 1 Implanted  PEG GEMINUS SMOOTH LOCK 2.0X22 - [id number] Peg PEG GEMINUS SMOOTH LOCK 2.0X22  SKELETAL DYNAMICS  Left 2 Implanted  PEG NON LOCK THREAD 2.7X24MM - [id number] Screw PEG NON LOCK THREAD 2.7X24MM  SKELETAL DYNAMICS  Left 1 Implanted  PEG GEMINUS THRD TPNL 2.7X26 - [id number] Peg PEG GEMINUS THRD TPNL 2.7X26  SKELETAL DYNAMICS  Left 1 Implanted     DRAINS: None  COMPLICATIONS: None  DESCRIPTION OF PROCEDURE:  The patient was met in the preoperative holding area where the surgical site was marked and the consent form was signed. The patient was brought to the operating room and remained on the stretcher.  A hand table was placed adjacent to the operative extremity and locked into place.  All bony prominences were well-padded.  Monitored anesthesia was induced.  A tourniquet was placed high on the left upper arm.  The left upper extremity was then prepped and draped in the usual and sterile fashion.  A formal timeout was performed to confirm that this is the correct patient, surgical side, surgical site, and surgical procedure.  All necessary implants were in the room.  Following formal timeout, a longitudinal incision was made directly overlying the flexor carpi radialis tendon.  The skin was incised.  Small crossing vessels were coagulated with bipolar cautery.  The volar aspect of the flexor carpi radialis tendon sheath was incised longitudinally.  The FCR tendon was retracted radially.  The floor of the FCR tendon sheath was similarly divided using a 15 blade scalpel.  Blunt dissection was used to develop Parona space between  the flexor pollicis longus and pronator quadratus.  The flexor pollicis longus was retracted ulnarly.  An L-shaped tenotomy was performed along the distal and radial aspect of the pronator quadratus tendon.  The pronator quadratus was subperiosteally dissected off of the volar aspect of the radius and the distal fracture fragments using an  elevator.  The fracture was reduced using a Therapist, nutritional to lever the distal segment back into anatomic alignment.  Reduction was further established with longitudinal traction and pressure over the dorsal aspect of the distal fragment.  A 4-hole wide plate was chosen given the size of his radius.  It was applied to the volar surface of the radius.  A bicortical 3.5 millimeter screw was then placed at the center of the oblong hole to allow for fine-tuning of the proximal to distal plate position.  With the plate appropriate positioned and the fracture reduced, K wires were then placed in the aiming guides at the radial and ulnar heads of the plate.  An AP and lateral view showed that the fracture was anatomically reduced.  The K wires were in a good, subchondral location.  The holes in the distal portion of the plate were then filled with smooth locking pegs taking care that the pegs did not penetrate the dorsal cortex.  The remaining holes in the shaft of the plate were then filled with bicortical 3.5 mm nonlocking screws.  AP, lateral, and 30 degree lateral views show that the fracture was anatomically reduced.  There was restoration of radial height, inclination, and volar tilt.  The articular surface was congruent on all views.  The distal locking screws were in a subchondral position.  All screws appear to be of the appropriate length.  The DRUJ was examined and found to be stable.  The wound was then thoroughly irrigated with copious sterile saline.  The pronator quadratus was then repaired distally using a 4-0 Vicryl suture in simple fashion.  The skin was closed with sparse 3-0 Monocryl sutures in a buried interrupted fashion.  The tourniquet was then deflated.  Hemostasis was achieved with bipolar electrocautery and with direct pressure over the wound.  The wound was hemostatic.  The fingers were pink and well-perfused.  The skin was then closed using a 4-0 nylon suture in horizontal mattress fashion.   The skin was then cleaned and dressed with Xeroform, folded Kerlix, cast padding, and a well-padded forearm splint was applied.  The patient was reversed from sedation.  All counts were correct x 2 at the end of the procedure. The patient was then taken to the PACU in stable condition.   POSTOPERATIVE PLAN: The patient will be discharged to home with appropriate pain medication and discharge instructions.  I'll see him back in the office in 10-14 days for his first postop visit. A referral has been placed to hand therapy.   Auburn Blaze, MD 11:43 AM

## 2024-02-02 NOTE — Progress Notes (Signed)
Assisted Dr. Daiva Huge with left, supraclavicular, ultrasound guided block. Side rails up, monitors on throughout procedure. See vital signs in flow sheet. Tolerated Procedure well. ?

## 2024-02-02 NOTE — Discharge Instructions (Addendum)
 Auburn Blaze, M.D. Hand Surgery  POST-OPERATIVE DISCHARGE INSTRUCTIONS   PRESCRIPTIONS: You may have been given a prescription to be taken as directed for post-operative pain control.  You may also take over the counter ibuprofen /aleve  and tylenol  for pain. Take this as directed on the packaging. Do not exceed 3000 mg tylenol /acetaminophen  in 24 hours.  Ibuprofen  600-800 mg (3-4) tablets by mouth every 6 hours as needed for pain.  OR Aleve  2 tablets by mouth every 12 hours (twice daily) as needed for pain.  AND/OR Tylenol  1000 mg (2 tablets) every 8 hours as needed for pain.  Please use your pain medication carefully, as refills are limited and you may not be provided with one.  As stated above, please use over the counter pain medicine - it will also be helpful with decreasing your swelling.    ANESTHESIA: After your surgery, post-surgical discomfort or pain is likely. This discomfort can last several days to a few weeks. At certain times of the day your discomfort may be more intense.   Did you receive a nerve block?  A nerve block can provide pain relief for one hour to two days after your surgery. As long as the nerve block is working, you will experience little or no sensation in the area the surgeon operated on.  As the nerve block wears off, you will begin to experience pain or discomfort. It is very important that you begin taking your prescribed pain medication before the nerve block fully wears off. Treating your pain at the first sign of the block wearing off will ensure your pain is better controlled and more tolerable when full-sensation returns. Do not wait until the pain is intolerable, as the medicine will be less effective. It is better to treat pain in advance than to try and catch up.   General Anesthesia:  If you did not receive a nerve block during your surgery, you will need to start taking your pain medication shortly after your surgery and should continue  to do so as prescribed by your surgeon.     ICE AND ELEVATION: You may use ice for the first 48-72 hours, but it is not critical.   Motion of your fingers is very important to decrease the swelling.  Elevation, as much as possible for the next 48 hours, is critical for decreasing swelling as well as for pain relief. Elevation means when you are seated or lying down, you hand should be at or above your heart. When walking, the hand needs to be at or above the level of your elbow.  If the bandage gets too tight, it may need to be loosened. Please contact our office and we will instruct you in how to do this.    SURGICAL BANDAGES:  Keep your dressing and/or splint clean and dry at all times.  Do not remove until you are seen again in the office.  If careful, you may place a plastic bag over your bandage and tape the end to shower, but be careful, do not get your bandages wet.     HAND THERAPY:  You will be contacted to set up your first therapy visit.    ACTIVITY AND WORK: You are encouraged to move any fingers which are not in the bandage.  Light use of the fingers is allowed to assist the other hand with daily hygiene and eating, but strong gripping or lifting is often uncomfortable and should be avoided.  You might miss a variable  period of time from work and hopefully this issue has been discussed prior to surgery. You may not do any heavy work with your affected hand for about 2 weeks.    [facility] Second Floor, [address] [place], [place] [address] [phone]     Post Anesthesia Home Care Instructions  Activity: Get plenty of rest for the remainder of the day. A responsible individual must stay with you for 24 hours following the procedure.  For the next 24 hours, DO NOT: -Drive a car -Advertising copywriter -Drink alcoholic beverages -Take any medication unless instructed by your physician -Make any legal decisions or sign important papers.  Meals: Start  with liquid foods such as gelatin or soup. Progress to regular foods as tolerated. Avoid greasy, spicy, heavy foods. If nausea and/or vomiting occur, drink only clear liquids until the nausea and/or vomiting subsides. Call your physician if vomiting continues.  Special Instructions/Symptoms: Your throat may feel dry or sore from the anesthesia or the breathing tube placed in your throat during surgery. If this causes discomfort, gargle with warm salt water. The discomfort should disappear within 24 hours.

## 2024-02-02 NOTE — Interval H&P Note (Signed)
 History and Physical Interval Note:  02/02/2024 10:06 AM  Randy Schmitt  has presented today for surgery, with the diagnosis of Left distal radius fracture.  The various methods of treatment have been discussed with the patient and family. After consideration of risks, benefits and other options for treatment, the patient has consented to  Procedure(s): OPEN REDUCTION INTERNAL FIXATION (ORIF) DISTAL RADIUS FRACTURE (Left) as a surgical intervention.  The patient's history has been reviewed, patient examined, no change in status, stable for surgery.  I have reviewed the patient's chart and labs.  Questions were answered to the patient's satisfaction.     [name]

## 2024-02-02 NOTE — Anesthesia Postprocedure Evaluation (Signed)
 Anesthesia Post Note  Patient: Randy Schmitt  Procedure(s) Performed: OPEN REDUCTION INTERNAL FIXATION (ORIF) DISTAL RADIUS FRACTURE (Left: Wrist)     Patient location during evaluation: PACU Anesthesia Type: Regional Level of consciousness: awake and alert Pain management: pain level controlled Vital Signs Assessment: post-procedure vital signs reviewed and stable Respiratory status: spontaneous breathing, nonlabored ventilation and respiratory function stable Cardiovascular status: blood pressure returned to baseline Postop Assessment: no apparent nausea or vomiting Anesthetic complications: no   No notable events documented.  Last Vitals:  Vitals:   02/02/24 1145 02/02/24 1231  BP:  (!) 109/58  Pulse:  (!) 50  Resp:  20  Temp:  (!) 36.2 C  SpO2: 94% 98%    Last Pain:  Vitals:   02/02/24 1231  TempSrc: Temporal  PainSc: 0-No pain                 [name]

## 2024-02-02 NOTE — Transfer of Care (Signed)
 Immediate Anesthesia Transfer of Care Note  Patient: Randy Schmitt  Procedure(s) Performed: OPEN REDUCTION INTERNAL FIXATION (ORIF) DISTAL RADIUS FRACTURE (Left: Wrist)  Patient Location: PACU  Anesthesia Type:MAC and Regional  Level of Consciousness: awake, alert , and patient cooperative  Airway & Oxygen Therapy: Patient Spontanous Breathing  Post-op Assessment: Report given to RN and Post -op Vital signs reviewed and stable  Post vital signs: Reviewed and stable  Last Vitals:  Vitals Value Taken Time  BP 136/79 02/02/24 1140  Temp    Pulse 78 02/02/24 1142  Resp 11 02/02/24 1142  SpO2 95 % 02/02/24 1142  Vitals shown include unfiled device data.  Last Pain:  Vitals:   02/02/24 0918  TempSrc: Oral  PainSc: 2       Patients Stated Pain Goal: 5 (02/02/24 1610)  Complications: No notable events documented.

## 2024-02-02 NOTE — H&P (Signed)
 HAND SURGERY   HPI: Patient is a 23 y.o. male who presents with a closed, left distal radius fracture.  He presents today for surgical management.  Patient denies any changes to their medical history or new systemic symptoms today.    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   Meningitis 07/22/2012   Otitis externa of left ear 09/03/2022   Past Surgical History:  Procedure Laterality Date   HYDROCELE EXCISION     ORIF SCAPHOID FRACTURE Left 12/03/2021   Procedure: LEFT OPEN REDUCTION INTERNAL FIXATION (ORIF) SCAPHOID FRACTURE WITH DISTAL RADIUS BONE GRAFT HARVEST;  Surgeon: Marilyn Shropshire, MD;  Location: [facility];  Service: Orthopedics;  Laterality: Left;  Regional block   Social History   Socioeconomic History   Marital status: Single    Spouse name: Not on file   Number of children: Not on file   Years of education: Not on file   Highest education level: Not on file  Occupational History   Not on file  Tobacco Use   Smoking status: Every Day    Current packs/day: 1.00    Types: Cigarettes    Passive exposure: Yes   Smokeless tobacco: Never  Vaping Use   Vaping status: Never Used  Substance and Sexual Activity   Alcohol use: Not Currently    Comment: history of alcohol use disorder   Drug use: Not Currently    Comment: thc as a teen   Sexual activity: Not on file  Other Topics Concern   Not on file  Social History Narrative   Not on file   Social Drivers of Health   Financial Resource Strain: Not on file  Food Insecurity: Not on file  Transportation Needs: Not on file  Physical Activity: Not on file  Stress: Not on file  Social Connections: Not on file   Family History  Problem Relation Age of Onset   Hypertension Father    Hypertension Maternal Grandfather    Healthy Mother    - negative  except otherwise stated in the family history section No Known Allergies Prior to Admission medications   Medication Sig Start Date End Date Taking? Authorizing Provider  oxyCODONE -acetaminophen  (PERCOCET/ROXICET) 5-325 MG tablet Take 1 tablet by mouth every 6 (six) hours as needed for severe pain (pain score 7-10). 01/22/24  Yes Barrett, Jamie N, PA-C  oxyCODONE -acetaminophen  (PERCOCET/ROXICET) 5-325 MG tablet Take 1 tablet by mouth every 6 (six) hours as needed for severe pain (pain score 7-10). 01/22/24   Barrett, Kandace Organ, PA-C   No results found. - Positive ROS: All other systems have been reviewed and were otherwise negative with the exception of those mentioned in the HPI and as above.  Physical Exam: General: No acute distress, resting comfortably Cardiovascular: BUE warm and well perfused, normal rate Respiratory: Normal WOB on RA Skin: Warm and dry Neurologic: Sensation intact distally Psychiatric: Patient is at baseline mood and affect  Left Upper Extremity  Splint is clean and dry.  He has full AROM of his fingers.  SILT m/u/r distributions.  His fingers are warm and well perfused w/ BCR.     Assessment: 23 yo M w/ closed, left distal radius fracture  Plan: OR today for ORIF left distal radius  We reviewed the nature of his injury at length and discussed both conservative and surgical treatment options. We reviewed  the risks of surgery which include bleeding, infection, damage to neurovascular structures, persistent symptoms, nonunion, malunion, delayed wound healing, need for additional surgery.    Marilyn Shropshire, M.D. [facility] 7:18 AM

## 2024-02-02 NOTE — Anesthesia Procedure Notes (Signed)
 Anesthesia Regional Block: Supraclavicular block   Pre-Anesthetic Checklist: , timeout performed,  Correct Patient, Correct Site, Correct Laterality,  Correct Procedure, Correct Position, site marked,  Risks and benefits discussed,  Pre-op evaluation,  At surgeon's request and post-op pain management  Laterality: Left  Prep: Maximum Sterile Barrier Precautions used, chloraprep       Needles:  Injection technique: Single-shot  Needle Type: Echogenic Stimulator Needle     Needle Length: 9cm  Needle Gauge: 22     Additional Needles:   Procedures:,,,, ultrasound used (permanent image in chart),,    Narrative:  Start time: 02/02/2024 9:47 AM End time: 02/02/2024 9:50 AM Injection made incrementally with aspirations every 5 mL.  Performed by: Personally  Anesthesiologist: Vernadine Golas, MD  Additional Notes: Risks, benefits, and alternative discussed. Patient gave consent for procedure. Patient prepped and draped in sterile fashion. Sedation administered, patient remains easily responsive to voice. Relevant anatomy identified with ultrasound guidance. Local anesthetic given in 5cc increments with no signs or symptoms of intravascular injection. No pain or paraesthesias with injection. Patient monitored throughout procedure with signs of LAST or immediate complications. Tolerated well. Ultrasound image placed in chart.  Amador Junes, MD

## 2024-02-03 ENCOUNTER — Encounter (HOSPITAL_BASED_OUTPATIENT_CLINIC_OR_DEPARTMENT_OTHER): Payer: Self-pay | Admitting: Orthopedic Surgery

## 2024-03-22 ENCOUNTER — Encounter: Payer: MEDICAID | Admitting: Family Medicine

## 2024-05-25 ENCOUNTER — Ambulatory Visit: Payer: Self-pay

## 2024-05-25 NOTE — Telephone Encounter (Signed)
 FYI Only or Action Required?: FYI only for provider.  Patient was last seen in primary care on 11/18/2023 by Alphonsa Glendia LABOR, MD.  Called Nurse Triage reporting Back Pain.  Symptoms began over year.  Interventions attempted: Nothing.  Symptoms are: gradually worsening.  Triage Disposition: See PCP Within 2 Weeks  Patient/caregiver understands and will follow disposition?: Yes   Copied from CRM [id number]. Topic: Clinical - Red Word Triage >> May 25, 2024  5:14 PM Randy Schmitt wrote: Red Word that prompted transfer to Nurse Triage: Severe back pain over the last year and it's getting worse. Reason for Disposition  Back pain present > 2 weeks  Answer Assessment - Initial Assessment Questions 1. ONSET: When did the pain begin? (e.g., minutes, hours, days)     Over 1 year and worsening 2. LOCATION: Where does it hurt? (upper, mid or lower back)     Mid and lower back 3. SEVERITY: How bad is the pain?  (e.g., Scale 1-10; mild, moderate, or severe)     6 to 9/10 4. PATTERN: Is the pain constant? (e.g., yes, no; constant, intermittent)      constant 5. RADIATION: Does the pain shoot into your legs or somewhere else?     no 6. CAUSE:  What do you think is causing the back pain?      unknown 7. BACK OVERUSE:  Any recent lifting of heavy objects, strenuous work or exercise?     na 8. MEDICINES: What have you taken so far for the pain? (e.g., nothing, acetaminophen , NSAIDS)      9. NEUROLOGIC SYMPTOMS: Do you have any weakness, numbness, or problems with bowel/bladder control?     no 10. OTHER SYMPTOMS: Do you have any other symptoms? (e.g., fever, abdomen pain, burning with urination, blood in urine)       no 11. PREGNANCY: Is there any chance you are pregnant? When was your last menstrual period?       na  Protocols used: Back Pain-A-AH

## 2024-05-26 NOTE — Telephone Encounter (Signed)
 Appt scheduled

## 2024-05-31 ENCOUNTER — Other Ambulatory Visit: Payer: Self-pay | Admitting: Family Medicine

## 2024-05-31 ENCOUNTER — Encounter: Payer: Self-pay | Admitting: Family Medicine

## 2024-05-31 ENCOUNTER — Ambulatory Visit: Payer: Self-pay | Admitting: Family Medicine

## 2024-05-31 ENCOUNTER — Ambulatory Visit (HOSPITAL_COMMUNITY)
Admission: RE | Admit: 2024-05-31 | Discharge: 2024-05-31 | Disposition: A | Discharge status: Home / Self Care | Payer: MEDICAID | Source: Ambulatory Visit | Attending: Family Medicine | Admitting: Family Medicine

## 2024-05-31 ENCOUNTER — Ambulatory Visit (INDEPENDENT_AMBULATORY_CARE_PROVIDER_SITE_OTHER): Payer: MEDICAID | Admitting: Family Medicine

## 2024-05-31 VITALS — BP 128/80 | HR 70 | Ht 73.0 in | Wt 216.0 lb

## 2024-05-31 DIAGNOSIS — M546 Pain in thoracic spine: Secondary | ICD-10-CM | POA: Insufficient documentation

## 2024-05-31 DIAGNOSIS — M545 Low back pain, unspecified: Secondary | ICD-10-CM | POA: Diagnosis present

## 2024-05-31 DIAGNOSIS — M4317 Spondylolisthesis, lumbosacral region: Secondary | ICD-10-CM

## 2024-05-31 MED ORDER — MELOXICAM 15 MG PO TABS: 15.0000 mg | ORAL_TABLET | Freq: Every day | ORAL | 0 refills | Status: AC | PRN

## 2024-05-31 NOTE — Assessment & Plan Note (Signed)
X-ray for further evaluation.  Meloxicam as directed. 

## 2024-05-31 NOTE — Patient Instructions (Signed)
 Xrays at the hospital.  We will call with results.  Medication as directed.

## 2024-05-31 NOTE — Progress Notes (Signed)
 Subjective:  Patient ID: Randy Schmitt, male    DOB: July 09, 2001  Age: 23 y.o. MRN: [id number]  CC:   Chief Complaint  Patient presents with   Generalized Body Aches    Present over a year and getting worse, primarily in back (history of falling of roof, and ladders)    HPI:  23 year old male presents with chronic back pain.  Patient reports that he has had thoracolumbar back pain for the past year.  Seems to be worsening.  He reports that he had a recent fall 3 to 4 months ago.  [place]lem[place] off a piece of equipment.  He suffered a fracture of the radius which was subsequently repaired.  He states that he has been using Tylenol  and ice without significant improvement in his back pain.  He states that this is making it difficult for him to do his normal activities and work.  Patient Active Problem List   Diagnosis Date Noted   Thoracolumbar back pain 05/31/2024   Vasomotor rhinitis 06/02/2023   Tobacco use disorder 03/05/2023   Anxiety 03/04/2022   Alcohol use disorder in remission    Aseptic meningitis 07/22/2012    Social Hx   Social History   Socioeconomic History   Marital status: Single    Spouse name: Not on file   Number of children: Not on file   Years of education: Not on file   Highest education level: Not on file  Occupational History   Not on file  Tobacco Use   Smoking status: Every Day    Current packs/day: 1.00    Types: Cigarettes    Passive exposure: Yes   Smokeless tobacco: Never  Vaping Use   Vaping status: Every Day  Substance and Sexual Activity   Alcohol use: Not Currently    Comment: history of alcohol use disorder   Drug use: Not Currently    Comment: thc as a teen   Sexual activity: Not on file  Other Topics Concern   Not on file  Social History Narrative   Not on file   Social Drivers of Health   Financial Resource Strain: Not on file  Food Insecurity: Not on file  Transportation Needs: Not on file  Physical Activity: Not on file   Stress: Not on file  Social Connections: Not on file    Review of Systems Per HPI  Objective:  BP 128/80   Pulse 70   Ht 6' 1 (1.854 m)   Wt 216 lb (98 kg)   SpO2 97%   BMI 28.50 kg/m      05/31/2024    9:44 AM 02/02/2024   12:31 PM 02/02/2024   11:41 AM  BP/Weight  Systolic BP 128 109 136  Diastolic BP 80 58 79  Wt. (Lbs) 216    BMI 28.5 kg/m2      Physical Exam Vitals and nursing note reviewed.  Constitutional:      General: He is not in acute distress.    Appearance: Normal appearance.  HENT:     Head: Normocephalic and atraumatic.  Cardiovascular:     Rate and Rhythm: Normal rate and regular rhythm.  Pulmonary:     Effort: Pulmonary effort is normal.     Breath sounds: Normal breath sounds.  Musculoskeletal:     Comments: No tenderness of the thoracic or lumbar spine.  Neurological:     Mental Status: He is alert.     Lab Results  Component Value Date   WBC  5.2 12/14/2023   HGB 13.8 12/14/2023   HCT 41.6 12/14/2023   PLT 226 12/14/2023   GLUCOSE 100 (H) 12/14/2023   CHOL 140 12/14/2023   TRIG 72 12/14/2023   HDL 61 12/14/2023   LDLCALC 65 12/14/2023   ALT 13 12/14/2023   AST 25 12/14/2023   NA 142 12/14/2023   K 4.3 12/14/2023   CL 104 12/14/2023   CREATININE 0.78 12/14/2023   BUN 17 12/14/2023   CO2 24 12/14/2023   TSH 0.709 10/30/2021   HGBA1C 5.0 10/30/2021     Assessment & Plan:  Thoracolumbar back pain Assessment & Plan: X-ray for further evaluation.  Meloxicam  as directed.  Orders: -     DG Lumbar Spine Complete -     DG Thoracic Spine W/Swimmers  Other orders -     Meloxicam ; Take 1 tablet (15 mg total) by mouth daily as needed for pain.  Dispense: 30 tablet; Refill: 0    Follow-up:  Pending work up.  Jacqulyn Ahle DO [facility]

## 2024-06-19 NOTE — Progress Notes (Signed)
 Referring Physician:  Cook, Randy G, DO [address] [place] Schmitt Randy,  Randy Schmitt  Primary Physician:  Alphonsa Randy LABOR, MD  History of Present Illness: 06/26/2024 [name] has a history of anxiety.   1 year history of TL back pain with fall 3-4 months ago when he fell off a piece of equipment and had wrist fracture (had ORIF left distal radius 02/02/24).   1-2 year history of constant LBP that has gotten progressively worse. He has weakness in both legs along with tingling. He notes some numbness in his lower back. Pain is worse with  prolonged sitting, walking, bending. No alleviating factors.   Given mobic  by PCP on 05/31/24- has not started yet.   Tobacco use: quit smoking, but is vaping with nicotine .    Bowel/Bladder Dysfunction: none  Conservative measures:  Physical therapy: none Multimodal medical therapy including regular antiinflammatories:  Tylenol , Meloxicam     Injections:  No epidural steroid injections  Past Surgery: no spine surgery  Randy Schmitt has no symptoms of cervical myelopathy.  The symptoms are causing a significant impact on the patient's life.   Review of Systems:  A 10 point review of systems is negative, except for the pertinent positives and negatives detailed in the HPI.  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   Meningitis 07/22/2012   Otitis externa of left ear 09/03/2022    Past Surgical History: Past Surgical History:  Procedure Laterality Date   HYDROCELE EXCISION     OPEN REDUCTION INTERNAL FIXATION (ORIF) DISTAL RADIAL FRACTURE Left 02/02/2024   Procedure: OPEN REDUCTION INTERNAL FIXATION (ORIF) DISTAL RADIUS FRACTURE;  Surgeon: Randy Harari, MD;  Location: [facility];  Service: Orthopedics;  Laterality: Left;   ORIF SCAPHOID FRACTURE  Left 12/03/2021   Procedure: LEFT OPEN REDUCTION INTERNAL FIXATION (ORIF) SCAPHOID FRACTURE WITH DISTAL RADIUS BONE GRAFT HARVEST;  Surgeon: Randy Harari, MD;  Location: [facility];  Service: Orthopedics;  Laterality: Left;  Regional block    Allergies: Allergies as of 06/26/2024   (No Known Allergies)    Medications: Outpatient Encounter Medications as of 06/26/2024  Medication Sig   Acetaminophen  (TYLENOL  PO) Take by mouth.   meloxicam  (MOBIC ) 15 MG tablet Take 1 tablet (15 mg total) by mouth daily as needed for pain. (Patient not taking: Reported on 06/26/2024)   No facility-administered encounter medications on file as of 06/26/2024.    Social History: Social History   Tobacco Use   Smoking status: Former    Current packs/day: 0.00    Types: Cigarettes    Quit date: 06/26/2022    Years since quitting: 2.0    Passive exposure: Yes   Smokeless tobacco: Never  Vaping Use   Vaping status: Every Day  Substance Use Topics   Alcohol use: Not Currently    Comment: history of alcohol use disorder   Drug use: Not Currently    Comment: thc as a teen    Family Medical History: Family History  Problem Relation Age of Onset   Hypertension Father    Hypertension Maternal Grandfather    Healthy Mother     Physical Examination: Vitals:   06/26/24 0908  BP: 122/84    General: Patient is well developed, well nourished, calm, collected, and in no apparent distress. Attention to examination is appropriate.  Respiratory: Patient is breathing without any difficulty.   NEUROLOGICAL:     Awake, alert, oriented to person, place, and time.  Speech is clear and fluent. Fund of knowledge is appropriate.   Cranial Nerves: Pupils equal round and reactive to light.  Facial tone is symmetric.    No lower posterior lumbar tenderness.   No abnormal lesions on exposed skin.   Strength: Side Biceps Triceps Deltoid Interossei Grip Wrist Ext. Wrist Flex.  R 5 5 5 5 5 5  5   L 5 5 5 5 5 5 5    Side Iliopsoas Quads Hamstring PF DF EHL  R 5 5 5 5 5 5   L 5 5 5 5 5 5    Reflexes are 2+ and symmetric at the biceps, brachioradialis, patella and achilles.   Hoffman's is absent.  Clonus is not present.   Bilateral upper and lower extremity sensation is intact to light touch.     No pain with IR/ER of both hips.   Gait is normal.     Medical Decision Making  Imaging: Thoracic xrays dated 05/31/24:  FINDINGS: Normal alignment of the thoracic vertebral bodies. No loss of vertebral body height or disc height. No subluxation. Normal paraspinal lines.   IMPRESSION: No acute findings of the thoracic spine.     Electronically Signed   By: Randy Schmitt M.D.   On: 05/31/2024 11:15   Lumbar xrays dated 05/31/24:  FINDINGS: Bilateral pars defects at L5.  12 mm anterolisthesis of L5 on S1.   No loss vertebral body height or disc height. 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     Electronically Signed   By: Randy Schmitt M.D.   On: 05/31/2024 11:12    I have personally reviewed the images and agree with the above interpretation.  Assessment and Plan: [name] has a 1-2 year history of constant LBP that has gotten progressively worse. He has weakness in both legs along with tingling.   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  Treatment options discussed with patient and following plan made:   - Lumbar xrays with flex/ext ordered. - Lumbar MRI ordered to further evaluate back pain. Will do at [facility].  - Can start mobic  as prescribed by PCP. Take with food.  - Vaping cessation discussed and encouraged.  - He is doing Aeronautical engineer. Offered to take him out and he declines. He is looking for a less physical job.  - Will schedule follow up visit to review MRI and xrayresults once I get them back.   He notes intermittent  numbness, tingling in both hands and they can lock up on him. Discussed EMG/NCS. He declines for now.   I spent a total of 35 minutes in face-to-face and non-face-to-face activities related to this patient's care today including review of outside records, review of imaging, review of symptoms, physical exam, discussion of differential diagnosis, discussion of treatment options, and documentation.   Thank you for involving me in the care of this patient.   Glade Boys PA-C Dept. of Neurosurgery

## 2024-06-26 ENCOUNTER — Ambulatory Visit: Payer: MEDICAID | Admitting: Orthopedic Surgery

## 2024-06-26 ENCOUNTER — Encounter: Payer: Self-pay | Admitting: Orthopedic Surgery

## 2024-06-26 VITALS — BP 122/84 | Ht 73.0 in | Wt 222.2 lb

## 2024-06-26 DIAGNOSIS — M4316 Spondylolisthesis, lumbar region: Secondary | ICD-10-CM | POA: Diagnosis not present

## 2024-06-26 DIAGNOSIS — M47816 Spondylosis without myelopathy or radiculopathy, lumbar region: Secondary | ICD-10-CM | POA: Diagnosis not present

## 2024-06-26 DIAGNOSIS — M4310 Spondylolisthesis, site unspecified: Secondary | ICD-10-CM

## 2024-06-26 NOTE — Addendum Note (Signed)
 Addended byBETHA HILMA HASTINGS on: 06/26/2024 09:05 PM   Modules accepted: Orders

## 2024-06-26 NOTE — Patient Instructions (Signed)
 It was so nice to see you today. Thank you so much for coming in.    You have a pars defect at L5 with slip of L5-S1. This is likely what is causing your back pain.   I want to get an MRI of your lower back to look into things further. We will get this approved through your insurance and Zelda Salmon will call you to schedule the appointment. Ask about your patient responsibility. You do not need to pay this prior to getting MRI, they can bill you.   When you get the MRI done, remind them to do your lower back xrays as well.   After you have the MRI and xrays, it can take 14-28 days for me to get the results back. If I don't have them in 2 weeks, we will call to try to get the results.   Once I have the results, we will call you to schedule a follow up visit with me to review them.   Okay to start meloxicam  from your PCP to help with pain and inflammation. Take as directed with food.   Let me know if you need to come out of work.   Please do not hesitate to call if you have any questions or concerns. You can also message me in MyChart.   Glade Boys PA-C [phone]     The physicians and staff at [facility] are committed to providing excellent care. You may receive a survey asking for feedback about your experience at our office. We value you your feedback and appreciate you taking the time to to fill it out. The [facility] leadership team is also available to discuss your experience in person, feel free to contact us  [phone].

## 2024-10-17 ENCOUNTER — Other Ambulatory Visit: Payer: Self-pay

## 2024-10-17 ENCOUNTER — Ambulatory Visit
Admission: EM | Admit: 2024-10-17 | Discharge: 2024-10-17 | Disposition: A | Discharge status: Home / Self Care | Payer: MEDICAID | Attending: Family Medicine | Admitting: Family Medicine

## 2024-10-17 ENCOUNTER — Encounter: Payer: Self-pay | Admitting: Emergency Medicine

## 2024-10-17 DIAGNOSIS — J069 Acute upper respiratory infection, unspecified: Secondary | ICD-10-CM

## 2024-10-17 LAB — POC COVID19/FLU A&B COMBO
Covid Antigen, POC: NEGATIVE
Influenza A Antigen, POC: NEGATIVE
Influenza B Antigen, POC: NEGATIVE

## 2024-10-17 MED ORDER — PROMETHAZINE-DM 6.25-15 MG/5ML PO SYRP: 5.0000 mL | ORAL_SOLUTION | Freq: Four times a day (QID) | ORAL | 0 refills | Status: AC | PRN

## 2024-10-17 MED ORDER — LIDOCAINE VISCOUS HCL 2 % MT SOLN: 10.0000 mL | OROMUCOSAL | 0 refills | Status: AC | PRN

## 2024-10-17 NOTE — ED Provider Notes (Signed)
 " [facility]    CSN: [id number] Arrival date & time: 10/17/24  8177      History   Chief Complaint Chief Complaint  Patient presents with   Sore Throat    HPI Randy Schmitt is a 24 y.o. male.   Patient presenting today with 4-day history of cough, runny nose, sore throat, chest tightness, intermittent fevers and chills.  Denies chest pain, shortness of breath, abdominal pain, vomiting, diarrhea.  So far trying numerous over-the-counter cold congestion medications and pain relievers with minimal relief.  No known history of pertinent chronic medical problems per patient.    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   Meningitis 07/22/2012   Otitis externa of left ear 09/03/2022    Patient Active Problem List   Diagnosis Date Noted   Thoracolumbar back pain 05/31/2024   Vasomotor rhinitis 06/02/2023   Tobacco use disorder 03/05/2023   Anxiety 03/04/2022   Alcohol use disorder in remission    Aseptic meningitis 07/22/2012    Past Surgical History:  Procedure Laterality Date   HYDROCELE EXCISION     OPEN REDUCTION INTERNAL FIXATION (ORIF) DISTAL RADIAL FRACTURE Left 02/02/2024   Procedure: OPEN REDUCTION INTERNAL FIXATION (ORIF) DISTAL RADIUS FRACTURE;  Surgeon: Romona Harari, MD;  Location: [facility];  Service: Orthopedics;  Laterality: Left;   ORIF SCAPHOID FRACTURE Left 12/03/2021   Procedure: LEFT OPEN REDUCTION INTERNAL FIXATION (ORIF) SCAPHOID FRACTURE WITH DISTAL RADIUS BONE GRAFT HARVEST;  Surgeon: Romona Harari, MD;  Location: [facility];  Service: Orthopedics;  Laterality: Left;  Regional block       Home Medications    Prior to Admission medications  Medication Sig Start Date End Date Taking? Authorizing Provider  lidocaine  (XYLOCAINE ) 2 % solution Use as directed 10  mLs in the mouth or throat every 3 (three) hours as needed. 10/17/24  Yes Stuart Vernell Norris, PA-C  promethazine -dextromethorphan (PROMETHAZINE -DM) 6.25-15 MG/5ML syrup Take 5 mLs by mouth 4 (four) times daily as needed. 10/17/24  Yes Stuart Vernell Norris, PA-C  Acetaminophen  (TYLENOL  PO) Take by mouth.    [provider]  meloxicam  (MOBIC ) 15 MG tablet Take 1 tablet (15 mg total) by mouth daily as needed for pain. Patient not taking: No sig reported 05/31/24   Cook, Jayce G, DO    Family History Family History  Problem Relation Age of Onset   Hypertension Father    Hypertension Maternal Grandfather    Healthy Mother     Social History Social History[1]   Allergies   Patient has no known allergies.   Review of Systems Review of Systems PER HPI  Physical Exam Triage Vital Signs ED Triage Vitals  Encounter Vitals Group     BP 10/17/24 1835 139/80     Girls Systolic BP Percentile --      Girls Diastolic BP Percentile --      Boys Systolic BP Percentile --      Boys Diastolic BP Percentile --      Pulse Rate 10/17/24 1835 95     Resp 10/17/24 1835 20     Temp 10/17/24 1835 98.6 F (37 C)     Temp Source 10/17/24 1835 Oral     SpO2 10/17/24 1835 98 %     Weight --      Height --  Head Circumference --      Peak Flow --      Pain Score 10/17/24 1833 5     Pain Loc --      Pain Education --      Exclude from Growth Chart --    No data found.  Updated Vital Signs BP 139/80 (BP Location: Right Arm)   Pulse 95   Temp 98.6 F (37 C) (Oral)   Resp 20   SpO2 98%   Visual Acuity Right Eye Distance:   Left Eye Distance:   Bilateral Distance:    Right Eye Near:   Left Eye Near:    Bilateral Near:     Physical Exam Vitals and nursing note reviewed.  Constitutional:      Appearance: He is well-developed.  HENT:     Head: Atraumatic.     Right Ear: External ear normal.     Left Ear: External ear normal.     Nose: Rhinorrhea present.      Mouth/Throat:     Pharynx: Posterior oropharyngeal erythema present. No oropharyngeal exudate.  Eyes:     Conjunctiva/sclera: Conjunctivae normal.     Pupils: Pupils are equal, round, and reactive to light.  Cardiovascular:     Rate and Rhythm: Normal rate and regular rhythm.  Pulmonary:     Effort: Pulmonary effort is normal. No respiratory distress.     Breath sounds: No wheezing or rales.  Musculoskeletal:        General: Normal range of motion.     Cervical back: Normal range of motion and neck supple.  Lymphadenopathy:     Cervical: No cervical adenopathy.  Skin:    General: Skin is warm and dry.  Neurological:     Mental Status: He is alert and oriented to person, place, and time.  Psychiatric:        Behavior: Behavior normal.      UC Treatments / Results  Labs (all labs ordered are listed, but only abnormal results are displayed) Labs Reviewed  POC COVID19/FLU A&B COMBO    EKG   Radiology No results found.  Procedures Procedures (including critical care time)  Medications Ordered in UC Medications - No data to display  Initial Impression / Assessment and Plan / UC Course  I have reviewed the triage vital signs and the nursing notes.  Pertinent labs & imaging results that were available during my care of the patient were reviewed by me and considered in my medical decision making (see chart for details).     Vitals and exam reassuring, rapid flu and covid neg. Suspect viral URI. Treat with phenergan  dm, viscous lidocaine , OTC cold and congestion medications. Return precautions reviewed. Work note given  Final Clinical Impressions(s) / UC Diagnoses   Final diagnoses:  Viral URI with cough   Discharge Instructions   None    ED Prescriptions     Medication Sig Dispense Auth. Provider   promethazine -dextromethorphan (PROMETHAZINE -DM) 6.25-15 MG/5ML syrup Take 5 mLs by mouth 4 (four) times daily as needed. 100 mL Stuart Vernell Norris, PA-C    lidocaine  (XYLOCAINE ) 2 % solution Use as directed 10 mLs in the mouth or throat every 3 (three) hours as needed. 100 mL Stuart Vernell Norris, [place]      PDMP not reviewed this encounter.    [1]  Social History Tobacco Use   Smoking status: Some Days    Current packs/day: 0.00    Types: Cigarettes, Cigars    Last attempt  to quit: 06/26/2022    Years since quitting: 2.3    Passive exposure: Yes   Smokeless tobacco: Never  Vaping Use   Vaping status: Every Day  Substance Use Topics   Alcohol use: Yes    Comment: history of alcohol use disorder; occ/socially   Drug use: Not Currently    Comment: thc as a teen     Stuart Vernell Norris, [place] 10/17/24 1949  "

## 2024-10-17 NOTE — ED Triage Notes (Signed)
 Pt reports cough, runny nose, sore throat since last Thursday. Pt also reports intermittent fever, chills. Last dose of anti-fever/otc med this am.
